# Patient Record
Sex: Female | Born: 1955 | Race: White | Hispanic: No | Marital: Married | Smoking: Current some day smoker
Health system: Southern US, Community
[De-identification: ages and names within clinical notes are randomized; demographics above are authoritative.]

## PROBLEM LIST (undated history)

## (undated) DIAGNOSIS — F32A Depression, unspecified: Secondary | ICD-10-CM

## (undated) DIAGNOSIS — F419 Anxiety disorder, unspecified: Secondary | ICD-10-CM

## (undated) DIAGNOSIS — F329 Major depressive disorder, single episode, unspecified: Secondary | ICD-10-CM

## (undated) HISTORY — DX: Anxiety disorder, unspecified: F41.9

## (undated) HISTORY — PX: LEG SURGERY: SHX1003

---

## 2007-11-29 ENCOUNTER — Ambulatory Visit: Payer: Self-pay | Admitting: Internal Medicine

## 2008-09-12 ENCOUNTER — Ambulatory Visit: Payer: Self-pay | Admitting: Internal Medicine

## 2008-11-04 ENCOUNTER — Ambulatory Visit: Payer: Self-pay | Admitting: Family Medicine

## 2008-11-29 ENCOUNTER — Ambulatory Visit: Payer: Self-pay | Admitting: Internal Medicine

## 2009-04-29 ENCOUNTER — Emergency Department: Payer: Self-pay | Admitting: Emergency Medicine

## 2009-10-06 ENCOUNTER — Ambulatory Visit: Payer: Self-pay | Admitting: Unknown Physician Specialty

## 2010-03-30 ENCOUNTER — Ambulatory Visit: Payer: Self-pay | Admitting: Pain Medicine

## 2010-04-14 ENCOUNTER — Ambulatory Visit: Payer: Self-pay | Admitting: Pain Medicine

## 2010-05-18 ENCOUNTER — Ambulatory Visit: Payer: Self-pay | Admitting: Pain Medicine

## 2010-07-01 ENCOUNTER — Ambulatory Visit: Payer: Self-pay

## 2011-08-20 ENCOUNTER — Ambulatory Visit: Payer: Self-pay | Admitting: Internal Medicine

## 2011-10-28 DIAGNOSIS — G4733 Obstructive sleep apnea (adult) (pediatric): Secondary | ICD-10-CM | POA: Insufficient documentation

## 2011-10-28 DIAGNOSIS — F32A Depression, unspecified: Secondary | ICD-10-CM | POA: Insufficient documentation

## 2011-10-28 DIAGNOSIS — R413 Other amnesia: Secondary | ICD-10-CM | POA: Insufficient documentation

## 2011-10-28 DIAGNOSIS — F329 Major depressive disorder, single episode, unspecified: Secondary | ICD-10-CM | POA: Insufficient documentation

## 2012-04-19 ENCOUNTER — Ambulatory Visit: Payer: Self-pay | Admitting: Internal Medicine

## 2013-08-24 ENCOUNTER — Emergency Department: Payer: Self-pay | Admitting: Emergency Medicine

## 2013-08-27 DIAGNOSIS — S82143A Displaced bicondylar fracture of unspecified tibia, initial encounter for closed fracture: Secondary | ICD-10-CM | POA: Insufficient documentation

## 2013-11-27 DIAGNOSIS — F410 Panic disorder [episodic paroxysmal anxiety] without agoraphobia: Secondary | ICD-10-CM | POA: Diagnosis not present

## 2013-11-27 DIAGNOSIS — M545 Low back pain: Secondary | ICD-10-CM | POA: Diagnosis not present

## 2013-11-27 DIAGNOSIS — Z720 Tobacco use: Secondary | ICD-10-CM | POA: Diagnosis not present

## 2013-11-27 DIAGNOSIS — M5430 Sciatica, unspecified side: Secondary | ICD-10-CM | POA: Diagnosis not present

## 2013-11-27 DIAGNOSIS — R5383 Other fatigue: Secondary | ICD-10-CM | POA: Diagnosis not present

## 2013-11-27 DIAGNOSIS — M79604 Pain in right leg: Secondary | ICD-10-CM | POA: Diagnosis not present

## 2013-11-27 DIAGNOSIS — F5102 Adjustment insomnia: Secondary | ICD-10-CM | POA: Diagnosis not present

## 2013-11-27 DIAGNOSIS — F411 Generalized anxiety disorder: Secondary | ICD-10-CM | POA: Diagnosis not present

## 2013-12-07 DIAGNOSIS — J1189 Influenza due to unidentified influenza virus with other manifestations: Secondary | ICD-10-CM | POA: Diagnosis not present

## 2013-12-07 DIAGNOSIS — R112 Nausea with vomiting, unspecified: Secondary | ICD-10-CM | POA: Diagnosis not present

## 2013-12-12 DIAGNOSIS — R509 Fever, unspecified: Secondary | ICD-10-CM | POA: Diagnosis not present

## 2013-12-18 DIAGNOSIS — J1189 Influenza due to unidentified influenza virus with other manifestations: Secondary | ICD-10-CM | POA: Diagnosis not present

## 2013-12-18 DIAGNOSIS — J069 Acute upper respiratory infection, unspecified: Secondary | ICD-10-CM | POA: Diagnosis not present

## 2014-03-26 DIAGNOSIS — D485 Neoplasm of uncertain behavior of skin: Secondary | ICD-10-CM | POA: Diagnosis not present

## 2014-03-26 DIAGNOSIS — H9201 Otalgia, right ear: Secondary | ICD-10-CM | POA: Diagnosis not present

## 2014-03-26 DIAGNOSIS — J069 Acute upper respiratory infection, unspecified: Secondary | ICD-10-CM | POA: Diagnosis not present

## 2014-03-26 DIAGNOSIS — F17210 Nicotine dependence, cigarettes, uncomplicated: Secondary | ICD-10-CM | POA: Diagnosis not present

## 2014-03-26 DIAGNOSIS — F329 Major depressive disorder, single episode, unspecified: Secondary | ICD-10-CM | POA: Diagnosis not present

## 2014-04-05 DIAGNOSIS — L820 Inflamed seborrheic keratosis: Secondary | ICD-10-CM | POA: Diagnosis not present

## 2014-04-05 DIAGNOSIS — B360 Pityriasis versicolor: Secondary | ICD-10-CM | POA: Diagnosis not present

## 2014-06-25 DIAGNOSIS — F329 Major depressive disorder, single episode, unspecified: Secondary | ICD-10-CM | POA: Diagnosis not present

## 2014-06-25 DIAGNOSIS — M79604 Pain in right leg: Secondary | ICD-10-CM | POA: Diagnosis not present

## 2014-06-25 DIAGNOSIS — F411 Generalized anxiety disorder: Secondary | ICD-10-CM | POA: Diagnosis not present

## 2014-06-25 DIAGNOSIS — J309 Allergic rhinitis, unspecified: Secondary | ICD-10-CM | POA: Diagnosis not present

## 2014-06-25 DIAGNOSIS — M5440 Lumbago with sciatica, unspecified side: Secondary | ICD-10-CM | POA: Diagnosis not present

## 2014-07-26 DIAGNOSIS — F17210 Nicotine dependence, cigarettes, uncomplicated: Secondary | ICD-10-CM | POA: Diagnosis not present

## 2014-07-26 DIAGNOSIS — F331 Major depressive disorder, recurrent, moderate: Secondary | ICD-10-CM | POA: Diagnosis not present

## 2014-07-26 DIAGNOSIS — J019 Acute sinusitis, unspecified: Secondary | ICD-10-CM | POA: Diagnosis not present

## 2014-07-26 DIAGNOSIS — F411 Generalized anxiety disorder: Secondary | ICD-10-CM | POA: Diagnosis not present

## 2014-10-24 DIAGNOSIS — Z0000 Encounter for general adult medical examination without abnormal findings: Secondary | ICD-10-CM | POA: Diagnosis not present

## 2014-10-24 DIAGNOSIS — Z0001 Encounter for general adult medical examination with abnormal findings: Secondary | ICD-10-CM | POA: Diagnosis not present

## 2014-10-24 DIAGNOSIS — Z202 Contact with and (suspected) exposure to infections with a predominantly sexual mode of transmission: Secondary | ICD-10-CM | POA: Diagnosis not present

## 2014-10-24 DIAGNOSIS — F331 Major depressive disorder, recurrent, moderate: Secondary | ICD-10-CM | POA: Diagnosis not present

## 2014-10-24 DIAGNOSIS — M5440 Lumbago with sciatica, unspecified side: Secondary | ICD-10-CM | POA: Diagnosis not present

## 2014-10-24 DIAGNOSIS — E039 Hypothyroidism, unspecified: Secondary | ICD-10-CM | POA: Diagnosis not present

## 2014-10-24 DIAGNOSIS — I10 Essential (primary) hypertension: Secondary | ICD-10-CM | POA: Diagnosis not present

## 2014-10-24 DIAGNOSIS — F5105 Insomnia due to other mental disorder: Secondary | ICD-10-CM | POA: Diagnosis not present

## 2014-10-24 DIAGNOSIS — E782 Mixed hyperlipidemia: Secondary | ICD-10-CM | POA: Diagnosis not present

## 2014-10-24 DIAGNOSIS — F17210 Nicotine dependence, cigarettes, uncomplicated: Secondary | ICD-10-CM | POA: Diagnosis not present

## 2014-10-24 DIAGNOSIS — B179 Acute viral hepatitis, unspecified: Secondary | ICD-10-CM | POA: Diagnosis not present

## 2014-10-24 DIAGNOSIS — F411 Generalized anxiety disorder: Secondary | ICD-10-CM | POA: Diagnosis not present

## 2014-10-24 DIAGNOSIS — E559 Vitamin D deficiency, unspecified: Secondary | ICD-10-CM | POA: Diagnosis not present

## 2014-12-19 DIAGNOSIS — F5105 Insomnia due to other mental disorder: Secondary | ICD-10-CM | POA: Diagnosis not present

## 2014-12-19 DIAGNOSIS — F331 Major depressive disorder, recurrent, moderate: Secondary | ICD-10-CM | POA: Diagnosis not present

## 2014-12-24 ENCOUNTER — Ambulatory Visit (INDEPENDENT_AMBULATORY_CARE_PROVIDER_SITE_OTHER): Payer: Medicare Other | Admitting: Psychiatry

## 2014-12-24 ENCOUNTER — Encounter: Payer: Self-pay | Admitting: Psychiatry

## 2014-12-24 VITALS — BP 118/82 | HR 105 | Temp 98.5°F | Ht 60.0 in | Wt 151.4 lb

## 2014-12-24 DIAGNOSIS — F411 Generalized anxiety disorder: Secondary | ICD-10-CM

## 2014-12-24 DIAGNOSIS — F331 Major depressive disorder, recurrent, moderate: Secondary | ICD-10-CM | POA: Diagnosis not present

## 2014-12-24 MED ORDER — LAMOTRIGINE 25 MG PO TABS: 25.0000 mg | ORAL_TABLET | Freq: Every day | ORAL | Status: DC

## 2014-12-24 MED ORDER — TRAZODONE HCL 50 MG PO TABS: 50.0000 mg | ORAL_TABLET | Freq: Every day | ORAL | Status: DC

## 2014-12-24 MED ORDER — DULOXETINE HCL 60 MG PO CPEP: 60.0000 mg | ORAL_CAPSULE | Freq: Every day | ORAL | Status: DC

## 2014-12-24 MED ORDER — ALPRAZOLAM 0.5 MG PO TABS: 0.5000 mg | ORAL_TABLET | Freq: Two times a day (BID) | ORAL | Status: DC | PRN

## 2014-12-24 NOTE — Progress Notes (Signed)
Psychiatric Initial Adult Assessment   Patient Identification: Pamela Mckee MRN:  [id number] Date of Evaluation:  12/24/2014 Referral Source: [facility] Chief Complaint:   Chief Complaint    Establish Care; Anxiety; Depression     Visit Diagnosis:    ICD-9-CM ICD-10-CM   1. MDD (major depressive disorder), recurrent episode, moderate (HCC) 296.32 F33.1   2. Anxiety state 300.00 F41.1    Diagnosis:   Patient Active Problem List   Diagnosis Date Noted  . Closed fracture of tibial plateau [S82.143A] 08/27/2013  . Clinical depression [F32.9] 10/28/2011  . Amnesia [R41.3] 10/28/2011  . Obstructive apnea [G47.33] 10/28/2011   History of Present Illness:    Patient is a 59 year old female who presented for her  primary care office [facility]. She reported that she has long history of anxiety and depression and has been following her primary care physician's office as she has 2 sons with hunters syndrome. They were diagnosed since they were born. She reported that they have been following Dr. Jimmye Norman in this practice. She reported that they her condition is getting worse and she is the sole caregiver and has been feeling stressed out as she has to provide for them. Patient reported that she has been taking Cymbalta trazodone and Xanax on a when necessary basis. She reported that she was transferred here as her mood has been getting worse and she feels anxious and agitated at times. She was also tearful during the interview as she was talking in detail about her son's conditions. She reported that she has taking them to several different places and her son is undergoing hip replacement and was septic. He is awaiting an other surgery in this month. She reported that they take care of each other and she helps them as well as she does not want anyone else to be involved in their care. Patient reported that she sleeps well with the help of trazodone and does not want to try any  other medication. Patient reported that her primary care physician has already tried other medications for her depression and she did not respond well to them . She stated that she is interested in taking a mood stabilizer to control her more symptoms at this time. Patient currently denied having any suicidal homicidal ideations or plans. She stated that her daughter is addicted to medications and they have to hide their bills. She reported that she took all her son's Klonopin and then patient was providing her Xanax to the son as he ran out of the Klonopin. She reported that she has a 36-year-old grandson who does not have Hunter syndrome and she is very excited about the same.     Elements:  Location:  Depression and anxiety. Associated Signs/Symptoms: Depression Symptoms:  depressed mood, insomnia, fatigue, anxiety, loss of energy/fatigue, disturbed sleep, increased appetite, (Hypo) Manic Symptoms:  Flight of Ideas, Labiality of Mood, Anxiety Symptoms:  Excessive Worry, Psychotic Symptoms:  none PTSD Symptoms: Negative NA  Past Medical History:  Past Medical History  Diagnosis Date  . Anxiety     Past Surgical History  Procedure Laterality Date  . Cesarean section    . Leg surgery Right    Family History:  Family History  Problem Relation Age of Onset  . Alzheimer's disease Mother   . Dementia Mother   . Anxiety disorder Sister   . Thyroid cancer Sister    Social History:   Social History   Social History  . Marital Status: Married  Spouse Name: N/A  . Number of Children: N/A  . Years of Education: N/A   Social History Main Topics  . Smoking status: Current Some Day Smoker -- 0.20 packs/day    Types: Cigarettes    Start date: 12/24/1971  . Smokeless tobacco: Never Used  . Alcohol Use: None  . Drug Use: No     Comment: not since 1988  . Sexual Activity: Not Currently   Other Topics Concern  . None   Social History Narrative  . None   Additional  Social History:   Married x 2. Has two sons with Hunter syndrome. Has a daughter and she is a drug user. She stated that is concerned about her drug use. She has a 57 years old grand son who is not a carrier of X linked Hunter disease.   Musculoskeletal: Strength & Muscle Tone: within normal limits Gait & Station: normal Patient leans: Right  Psychiatric Specialty Exam: HPI  ROS  Blood pressure 118/82, pulse 105, temperature 98.5 F (36.9 C), temperature source Tympanic, height 5' (1.524 m), weight 151 lb 6.4 oz (68.675 kg), SpO2 97 %.Body mass index is 29.57 kg/(m^2).  General Appearance: Casual and Fairly Groomed  Eye Contact:  Fair  Speech:  Clear and Coherent  Volume:  Normal  Mood:  Anxious and Depressed  Affect:  Congruent  Thought Process:  Circumstantial  Orientation:  Full (Time, Place, and Person)  Thought Content:  WDL  Suicidal Thoughts:  No  Homicidal Thoughts:  No  Memory:  Immediate;   Fair  Judgement:  Fair  Insight:  Fair  Psychomotor Activity:  Normal  Concentration:  Fair  Recall:  [employer] [place]  Language: Fair  Akathisia:  No  Handed:  Right  AIMS (if indicated):    Assets:  Communication Skills Desire for Improvement Housing Physical Health Social Support  ADL's:  Intact  Cognition: WNL  Sleep:     Is the patient at risk to self?  No. Has the patient been a risk to self in the past 6 months?  No. Has the patient been a risk to self within the distant past?  No. Is the patient a risk to others?  No. Has the patient been a risk to others in the past 6 months?  No. Has the patient been a risk to others within the distant past?  No.  Allergies:  No Known Allergies Current Medications: Current Outpatient Prescriptions  Medication Sig Dispense Refill  . acetaminophen (TYLENOL) 325 MG tablet Take by mouth.    . ALPRAZolam (XANAX) 0.5 MG tablet Take by mouth.    Marland Kitchen atorvastatin (LIPITOR) 10 MG tablet     . DULoxetine (CYMBALTA) 30  MG capsule     . ketoconazole (NIZORAL) 2 % cream Apply to affected areas 1-3 times a week as needed for affected areas on trunk    . traMADol (ULTRAM) 50 MG tablet     . traZODone (DESYREL) 50 MG tablet     . Vitamin D, Ergocalciferol, (DRISDOL) 50000 UNITS CAPS capsule Take by mouth.     No current facility-administered medications for this visit.    Previous Psychotropic Medications:  Prozac Wellbutrin Celexa Zoloft Lexapro Valium Xanax Trazodone   Substance Abuse History in the last 12 months:  No.  Consequences of Substance Abuse: Negative NA  Medical Decision Making:  Review of Psycho-Social Stressors (1)  Treatment Plan Summary: Medication management   Mood stabilization  Discussed with patient about starting her  on lamotrigine 25 mg and she agreed with the plan. She will continue on Cymbalta 60 mg by mouth daily  Insomnia Patient will take trazodone 50 mg at bedtime  Anxiety Patient takes Xanax 0.5 mg daily and she has supply of the medications as she recently filled 90 pills on 12/04/2014 and has currently 55 fills of the medication and she takes it on a when necessary basis  Follow-up 1 month or earlier depending on her symptoms   More than 50% of the time spent in psychoeducation, counseling and coordination of care.    This note was generated in part or whole with voice recognition software. Voice regonition is usually quite accurate but there are transcription errors that can and very often do occur. I apologize for any typographical errors that were not detected and corrected.     Rainey Pines 11/1/20162:04 PM

## 2015-01-23 ENCOUNTER — Ambulatory Visit (INDEPENDENT_AMBULATORY_CARE_PROVIDER_SITE_OTHER): Payer: Medicare Other | Admitting: Psychiatry

## 2015-01-23 ENCOUNTER — Encounter: Payer: Self-pay | Admitting: Psychiatry

## 2015-01-23 VITALS — BP 120/84 | HR 99 | Temp 98.2°F | Ht 60.0 in | Wt 149.8 lb

## 2015-01-23 DIAGNOSIS — F331 Major depressive disorder, recurrent, moderate: Secondary | ICD-10-CM | POA: Diagnosis not present

## 2015-01-23 MED ORDER — DULOXETINE HCL 60 MG PO CPEP: 60.0000 mg | ORAL_CAPSULE | Freq: Every day | ORAL | Status: DC

## 2015-01-23 MED ORDER — LAMOTRIGINE 25 MG PO TABS: 25.0000 mg | ORAL_TABLET | Freq: Every day | ORAL | Status: DC

## 2015-01-23 MED ORDER — TRAZODONE HCL 50 MG PO TABS: 50.0000 mg | ORAL_TABLET | Freq: Every day | ORAL | Status: DC

## 2015-01-23 NOTE — Progress Notes (Signed)
Psychiatric MD NP Follow Up Note   Patient Identification: Pamela Mckee MRN:  [id number] Date of Evaluation:  01/23/2015 Referral Source: [facility] Chief Complaint:   Chief Complaint    Follow-up; Medication Refill     Visit Diagnosis:    ICD-9-CM ICD-10-CM   1. MDD (major depressive disorder), recurrent episode, moderate (HCC) 296.32 F33.1    Diagnosis:   Patient Active Problem List   Diagnosis Date Noted  . Closed fracture of tibial plateau [S82.143A] 08/27/2013  . Clinical depression [F32.9] 10/28/2011  . Amnesia [R41.3] 10/28/2011  . Obstructive apnea [G47.33] 10/28/2011   History of Present Illness:    Patient is a 59 year old female who presented for follow-up. She reported that she has been doing well and enjoyed the Thanksgiving with her sons. Patient reported that her daughter is coming from [place] for the Christmas and she has to hide all her pills from her daughter as well has her son's pain medication as her daughter has history of addiction. She was concerned about her behavior as she stole all their pills during her previous visit in the summer. Patient stated that she spent 6 hours at [facility] yesterday due to her son's illness as he has complicated medical history. She reported that he stresses her out and she has been feeling tired due to his behavior. However she is tries to be more supportive of his medical complications and he is going to have a hip surgery in early part of next year and will have a three-month rehabilitation. She was talking at length about his medical problems as well as about his behavioral issues. She reported that she has been compliant with her medications and her medications were filled recently by her primary care physician at [facility]. They have been prescribing her Xanax on a regular basis. She reported that she sleeps well with the help of trazodone. She currently denied having any adverse effects to the medication.  She reported that she has started taking lamotrigine and it has helped her and her mood is improving but she does not want to titrate the dose at this time. She denied having any mood swings anger anxiety or paranoia. She denied having any perceptual disturbances. She denied having any suicidal homicidal ideations or plans.     Elements:  Location:  Depression and anxiety. Associated Signs/Symptoms: Depression Symptoms:  depressed mood, insomnia, fatigue, anxiety, loss of energy/fatigue, disturbed sleep, increased appetite, (Hypo) Manic Symptoms:  Flight of Ideas, Labiality of Mood, Anxiety Symptoms:  Excessive Worry, Psychotic Symptoms:  none PTSD Symptoms: Negative NA  Past Medical History:  Past Medical History  Diagnosis Date  . Anxiety     Past Surgical History  Procedure Laterality Date  . Cesarean section    . Leg surgery Right    Family History:  Family History  Problem Relation Age of Onset  . Alzheimer's disease Mother   . Dementia Mother   . Anxiety disorder Sister   . Thyroid cancer Sister    Social History:   Social History   Social History  . Marital Status: Married    Spouse Name: N/A  . Number of Children: N/A  . Years of Education: N/A   Social History Main Topics  . Smoking status: Current Some Day Smoker -- 0.20 packs/day    Types: Cigarettes    Start date: 12/24/1971  . Smokeless tobacco: Never Used  . Alcohol Use: No  . Drug Use: No     Comment:  not since 1988  . Sexual Activity: Not Currently   Other Topics Concern  . None   Social History Narrative   Additional Social History:   Married x 2. Has two sons with Hunter syndrome. Has a daughter and she is a drug user. She stated that is concerned about her drug use. She has a 37 years old grand son who is not a carrier of X linked Hunter disease.   Musculoskeletal: Strength & Muscle Tone: within normal limits Gait & Station: normal Patient leans: Right  Psychiatric Specialty  Exam: HPI   ROS   Blood pressure 120/84, pulse 99, temperature 98.2 F (36.8 C), temperature source Tympanic, height 5' (1.524 m), weight 149 lb 12.8 oz (67.949 kg), SpO2 96 %.Body mass index is 29.26 kg/(m^2).  General Appearance: Casual and Fairly Groomed  Eye Contact:  Fair  Speech:  Clear and Coherent  Volume:  Normal  Mood:  Anxious and Depressed  Affect:  Congruent  Thought Process:  Circumstantial  Orientation:  Full (Time, Place, and Person)  Thought Content:  WDL  Suicidal Thoughts:  No  Homicidal Thoughts:  No  Memory:  Immediate;   Fair  Judgement:  Fair  Insight:  Fair  Psychomotor Activity:  Normal  Concentration:  Fair  Recall:  [employer] [place]  Language: Fair  Akathisia:  No  Handed:  Right  AIMS (if indicated):    Assets:  Communication Skills Desire for Improvement Housing Physical Health Social Support  ADL's:  Intact  Cognition: WNL  Sleep:     Is the patient at risk to self?  No. Has the patient been a risk to self in the past 6 months?  No. Has the patient been a risk to self within the distant past?  No. Is the patient a risk to others?  No. Has the patient been a risk to others in the past 6 months?  No. Has the patient been a risk to others within the distant past?  No.  Allergies:  No Known Allergies Current Medications: Current Outpatient Prescriptions  Medication Sig Dispense Refill  . acetaminophen (TYLENOL) 325 MG tablet Take by mouth.    . ALPRAZolam (XANAX) 0.5 MG tablet Take 1 tablet (0.5 mg total) by mouth 2 (two) times daily as needed for anxiety (Pt has 55 pills - no meds given at this time. She refilled on 12/04/14). 2 tablet 1  . atorvastatin (LIPITOR) 10 MG tablet     . DULoxetine (CYMBALTA) 60 MG capsule Take 1 capsule (60 mg total) by mouth daily. 30 capsule 1  . ketoconazole (NIZORAL) 2 % cream Apply to affected areas 1-3 times a week as needed for affected areas on trunk    . lamoTRIgine (LAMICTAL) 25 MG  tablet Take 1 tablet (25 mg total) by mouth daily. 30 tablet 1  . traMADol (ULTRAM) 50 MG tablet     . traZODone (DESYREL) 50 MG tablet Take 1 tablet (50 mg total) by mouth at bedtime. 30 tablet 1  . Vitamin D, Ergocalciferol, (DRISDOL) 50000 UNITS CAPS capsule Take by mouth.     No current facility-administered medications for this visit.    Previous Psychotropic Medications:  Prozac Wellbutrin Celexa Zoloft Lexapro Valium Xanax Trazodone   Substance Abuse History in the last 12 months:  No.  Consequences of Substance Abuse: Negative NA  Medical Decision Making:  Review of Psycho-Social Stressors (1)  Treatment Plan Summary: Medication management   Mood stabilization  Continue  lamotrigine 25  mg one pill daily.  She will continue on Cymbalta 60 mg by mouth daily  Insomnia Patient will take trazodone 50 mg at bedtime  Anxiety Patient takes Xanax 0.5 mg daily and  it is being prescribed by her primary care physician  Follow-up 2 month or earlier depending on her symptoms   More than 50% of the time spent in psychoeducation, counseling and coordination of care.    This note was generated in part or whole with voice recognition software. Voice regonition is usually quite accurate but there are transcription errors that can and very often do occur. I apologize for any typographical errors that were not detected and corrected.     Omir Cooprider 12/1/201610:07 AM

## 2015-01-27 DIAGNOSIS — F331 Major depressive disorder, recurrent, moderate: Secondary | ICD-10-CM | POA: Diagnosis not present

## 2015-01-27 DIAGNOSIS — J019 Acute sinusitis, unspecified: Secondary | ICD-10-CM | POA: Diagnosis not present

## 2015-01-27 DIAGNOSIS — H8149 Vertigo of central origin, unspecified ear: Secondary | ICD-10-CM | POA: Diagnosis not present

## 2015-03-27 ENCOUNTER — Ambulatory Visit: Payer: Medicare Other | Admitting: Psychiatry

## 2015-04-01 ENCOUNTER — Encounter: Payer: Self-pay | Admitting: Psychiatry

## 2015-04-01 ENCOUNTER — Ambulatory Visit (INDEPENDENT_AMBULATORY_CARE_PROVIDER_SITE_OTHER): Payer: Medicare Other | Admitting: Psychiatry

## 2015-04-01 VITALS — BP 138/88 | HR 123 | Temp 97.1°F | Ht 60.0 in | Wt 152.0 lb

## 2015-04-01 DIAGNOSIS — F331 Major depressive disorder, recurrent, moderate: Secondary | ICD-10-CM

## 2015-04-01 MED ORDER — DULOXETINE HCL 60 MG PO CPEP: 60.0000 mg | ORAL_CAPSULE | Freq: Every day | ORAL | Status: DC

## 2015-04-01 MED ORDER — LAMOTRIGINE 25 MG PO TABS: 50.0000 mg | ORAL_TABLET | Freq: Every day | ORAL | Status: DC

## 2015-04-01 MED ORDER — ALPRAZOLAM 0.5 MG PO TABS: 0.5000 mg | ORAL_TABLET | Freq: Every evening | ORAL | Status: DC | PRN

## 2015-04-01 MED ORDER — TRAZODONE HCL 50 MG PO TABS: 50.0000 mg | ORAL_TABLET | Freq: Every day | ORAL | Status: DC

## 2015-04-01 NOTE — Progress Notes (Signed)
Psychiatric MD NP Follow Up Note   Patient Identification: Pamela Mckee MRN:  [id number] Date of Evaluation:  04/01/2015 Referral Source: [facility] Chief Complaint:   Chief Complaint    Follow-up; Medication Refill     Visit Diagnosis:    ICD-9-CM ICD-10-CM   1. MDD (major depressive disorder), recurrent episode, moderate (HCC) 296.32 F33.1    Diagnosis:   Patient Active Problem List   Diagnosis Date Noted  . Closed fracture of tibial plateau [S82.143A] 08/27/2013  . Clinical depression [F32.9] 10/28/2011  . Amnesia [R41.3] 10/28/2011  . Obstructive apnea [G47.33] 10/28/2011   History of Present Illness:    Patient is a 60 year old female who presented for follow-up. She reported that she has been stressed out due to her sons who are disabled. She reported that one of her son keeps calling her in the middle of the night as he wants to be taken care of. She reported that she is becoming frustrated and is loosing temper. She reported that he is also going for her and her surgery in the end of this month. Patient reported that she feels that she wants her medications to be adjusted. She reported that she is trying to help her sons as much as she can but both are disabled and having issues with their temper. She reported that she loses temper quickly with them. Patient reported that she has been taking lamotrigine daily and we discussed about titrating the dose of the medication. Patient reported that she has taken Xanax only one time as she does not like taking it on a regular basis. She currently denied having any suicidal homicidal ideations or plans. She denied having any perceptual disturbances. She appeared cooperative and was explaining all the symptoms in detail.    Past Medical History:  Past Medical History  Diagnosis Date  . Anxiety     Past Surgical History  Procedure Laterality Date  . Cesarean section    . Leg surgery Right    Family History:  Family  History  Problem Relation Age of Onset  . Alzheimer's disease Mother   . Dementia Mother   . Anxiety disorder Sister   . Thyroid cancer Sister    Social History:   Social History   Social History  . Marital Status: Married    Spouse Name: N/A  . Number of Children: N/A  . Years of Education: N/A   Social History Main Topics  . Smoking status: Current Some Day Smoker -- 0.20 packs/day    Types: Cigarettes    Start date: 12/24/1971  . Smokeless tobacco: Never Used  . Alcohol Use: No  . Drug Use: No     Comment: not since 1988  . Sexual Activity: Not Currently   Other Topics Concern  . None   Social History Narrative   Additional Social History:   Married x 2. Has two sons with Hunter syndrome. Has a daughter and she is a drug user. She stated that is concerned about her drug use. She has a 47 years old grand son who is not a carrier of X linked Hunter disease.   Musculoskeletal: Strength & Muscle Tone: within normal limits Gait & Station: normal Patient leans: Right  Psychiatric Specialty Exam: HPI   ROS   Blood pressure 138/88, pulse 123, temperature 97.1 F (36.2 C), temperature source Tympanic, height 5' (1.524 m), weight 152 lb (68.947 kg), SpO2 93 %.Body mass index is 29.69 kg/(m^2).  General Appearance: Casual and Fairly Groomed  Eye Contact:  Fair  Speech:  Pressured  Volume:  Normal  Mood:  Anxious  Affect:  Congruent  Thought Process:  Circumstantial  Orientation:  Full (Time, Place, and Person)  Thought Content:  WDL  Suicidal Thoughts:  No  Homicidal Thoughts:  No  Memory:  Immediate;   Fair  Judgement:  Fair  Insight:  Fair  Psychomotor Activity:  Normal  Concentration:  Fair  Recall:  [employer] [place]  Language: Fair  Akathisia:  No  Handed:  Right  AIMS (if indicated):    Assets:  Communication Skills Desire for Improvement Housing Physical Health Social Support  ADL's:  Intact  Cognition: WNL  Sleep:     Is the  patient at risk to self?  No. Has the patient been a risk to self in the past 6 months?  No. Has the patient been a risk to self within the distant past?  No. Is the patient a risk to others?  No. Has the patient been a risk to others in the past 6 months?  No. Has the patient been a risk to others within the distant past?  No.  Allergies:  No Known Allergies Current Medications: Current Outpatient Prescriptions  Medication Sig Dispense Refill  . acetaminophen (TYLENOL) 325 MG tablet Take by mouth.    . ALPRAZolam (XANAX) 0.5 MG tablet Take 1 tablet (0.5 mg total) by mouth 2 (two) times daily as needed for anxiety (Pt has 55 pills - no meds given at this time. She refilled on 12/04/14). 2 tablet 1  . atorvastatin (LIPITOR) 10 MG tablet     . DULoxetine (CYMBALTA) 60 MG capsule Take 1 capsule (60 mg total) by mouth daily. 30 capsule 1  . ketoconazole (NIZORAL) 2 % cream Apply to affected areas 1-3 times a week as needed for affected areas on trunk    . lamoTRIgine (LAMICTAL) 25 MG tablet Take 1 tablet (25 mg total) by mouth daily. 30 tablet 1  . traMADol (ULTRAM) 50 MG tablet     . traZODone (DESYREL) 50 MG tablet Take 1 tablet (50 mg total) by mouth at bedtime. 30 tablet 1  . Vitamin D, Ergocalciferol, (DRISDOL) 50000 UNITS CAPS capsule Take by mouth.     No current facility-administered medications for this visit.    Previous Psychotropic Medications:  Prozac Wellbutrin Celexa Zoloft Lexapro Valium Xanax Trazodone   Substance Abuse History in the last 12 months:  No.  Consequences of Substance Abuse: Negative NA  Medical Decision Making:  Review of Psycho-Social Stressors (1)  Treatment Plan Summary: Medication management   Mood stabilization  Continue  lamotrigine and I will titrate the dose to 50 g daily. She will continue on Cymbalta 60 mg by mouth daily  Insomnia Patient will take trazodone 50 mg at bedtime  Anxiety Patient takes Xanax 0.5 mg daily and  I  will refill on a when necessary basis.  Follow-up 3 month or earlier depending on her symptoms   More than 50% of the time spent in psychoeducation, counseling and coordination of care.    This note was generated in part or whole with voice recognition software. Voice regonition is usually quite accurate but there are transcription errors that can and very often do occur. I apologize for any typographical errors that were not detected and corrected.    Rainey Pines, MD  2/7/201710:38 AM

## 2015-04-15 ENCOUNTER — Emergency Department
Admission: EM | Admit: 2015-04-15 | Discharge: 2015-04-15 | Disposition: A | Discharge status: Home / Self Care | Payer: PRIVATE HEALTH INSURANCE | Attending: Emergency Medicine | Admitting: Emergency Medicine

## 2015-04-15 ENCOUNTER — Encounter: Payer: Self-pay | Admitting: Emergency Medicine

## 2015-04-15 DIAGNOSIS — F419 Anxiety disorder, unspecified: Secondary | ICD-10-CM | POA: Diagnosis not present

## 2015-04-15 DIAGNOSIS — Z79899 Other long term (current) drug therapy: Secondary | ICD-10-CM | POA: Diagnosis not present

## 2015-04-15 DIAGNOSIS — R002 Palpitations: Secondary | ICD-10-CM | POA: Diagnosis not present

## 2015-04-15 DIAGNOSIS — F17210 Nicotine dependence, cigarettes, uncomplicated: Secondary | ICD-10-CM | POA: Diagnosis not present

## 2015-04-15 HISTORY — DX: Major depressive disorder, single episode, unspecified: F32.9

## 2015-04-15 HISTORY — DX: Depression, unspecified: F32.A

## 2015-04-15 LAB — CBC
HCT: 39.8 % (ref 35.0–47.0)
Hemoglobin: 13.8 g/dL (ref 12.0–16.0)
MCH: 31.6 pg (ref 26.0–34.0)
MCHC: 34.6 g/dL (ref 32.0–36.0)
MCV: 91.2 fL (ref 80.0–100.0)
Platelets: 279 10*3/uL (ref 150–440)
RBC: 4.36 MIL/uL (ref 3.80–5.20)
RDW: 12.6 % (ref 11.5–14.5)
WBC: 8.6 10*3/uL (ref 3.6–11.0)

## 2015-04-15 LAB — COMPREHENSIVE METABOLIC PANEL
ALT: 22 U/L (ref 14–54)
AST: 21 U/L (ref 15–41)
Albumin: 4 g/dL (ref 3.5–5.0)
Alkaline Phosphatase: 76 U/L (ref 38–126)
Anion gap: 5 (ref 5–15)
BUN: 11 mg/dL (ref 6–20)
CO2: 28 mmol/L (ref 22–32)
Calcium: 9.1 mg/dL (ref 8.9–10.3)
Chloride: 105 mmol/L (ref 101–111)
Creatinine, Ser: 0.85 mg/dL (ref 0.44–1.00)
GFR calc Af Amer: >60 mL/min (ref >60)
GFR calc non Af Amer: >60 mL/min (ref >60)
Glucose, Bld: 83 mg/dL (ref 65–99)
Potassium: 4 mmol/L (ref 3.5–5.1)
Sodium: 138 mmol/L (ref 135–145)
Total Bilirubin: 0.6 mg/dL (ref 0.3–1.2)
Total Protein: 7.1 g/dL (ref 6.5–8.1)

## 2015-04-15 LAB — TSH: TSH: 1.093 u[IU]/mL (ref 0.350–4.500)

## 2015-04-15 LAB — TROPONIN I: Troponin I: <0.03 ng/mL (ref <0.031)

## 2015-04-15 LAB — FIBRIN DERIVATIVES D-DIMER (ARMC ONLY): Fibrin derivatives D-dimer (ARMC): 240 (ref 0–499)

## 2015-04-15 NOTE — ED Provider Notes (Signed)
[facility] Emergency Department Provider Note  ____________________________________________    I have reviewed the triage vital signs and the nursing notes.   HISTORY  Chief Complaint Hypertension    HPI Pamela Mckee is a 60 y.o. female who presents with complaints of hypertension and palpitations. Patient chooses this to her anxiety and difficulty taking care of her sons with disabilities. She denies chest pain today. No abdominal pain. No recent travel. No calf pain or swelling. She is had occasional nausea but not today. No fevers or chills.     Past Medical History  Diagnosis Date  . Anxiety   . Depression     Patient Active Problem List   Diagnosis Date Noted  . Closed fracture of tibial plateau 08/27/2013  . Clinical depression 10/28/2011  . Amnesia 10/28/2011  . Obstructive apnea 10/28/2011    Past Surgical History  Procedure Laterality Date  . Cesarean section    . Leg surgery Right     Current Outpatient Rx  Name  Route  Sig  Dispense  Refill  . acetaminophen (TYLENOL) 325 MG tablet   Oral   Take by mouth.         . ALPRAZolam (XANAX) 0.5 MG tablet   Oral   Take 1 tablet (0.5 mg total) by mouth at bedtime as needed for anxiety.   30 tablet   2   . atorvastatin (LIPITOR) 10 MG tablet               . DULoxetine (CYMBALTA) 60 MG capsule   Oral   Take 1 capsule (60 mg total) by mouth daily.   30 capsule   2   . ketoconazole (NIZORAL) 2 % cream      Apply to affected areas 1-3 times a week as needed for affected areas on trunk         . lamoTRIgine (LAMICTAL) 25 MG tablet   Oral   Take 2 tablets (50 mg total) by mouth daily.   60 tablet   2   . traMADol (ULTRAM) 50 MG tablet               . traZODone (DESYREL) 50 MG tablet   Oral   Take 1 tablet (50 mg total) by mouth at bedtime.   30 tablet   2   . Vitamin D, Ergocalciferol, (DRISDOL) 50000 UNITS CAPS capsule   Oral   Take by mouth.            Allergies Review of patient's allergies indicates no known allergies.  Family History  Problem Relation Age of Onset  . Alzheimer's disease Mother   . Dementia Mother   . Anxiety disorder Sister   . Thyroid cancer Sister     Social History Social History  Substance Use Topics  . Smoking status: Current Some Day Smoker -- 0.20 packs/day    Types: Cigarettes    Start date: 12/24/1971  . Smokeless tobacco: Never Used  . Alcohol Use: No    Review of Systems  Constitutional: Negative for fever. Eyes: Negative for visual changes. ENT: Negative for sore throat Cardiovascular: Negative for chest pain. Other for palpitations Respiratory: Negative for shortness of breath. Gastrointestinal: Negative for abdominal pain, vomiting and diarrhea. Genitourinary: Negative for dysuria. Musculoskeletal: Negative for back pain. Skin: Negative for rash. Neurological: Negative for headaches or focal weakness Psychiatric: Positive for anxiety    ____________________________________________   PHYSICAL EXAM:  VITAL SIGNS: ED Triage Vitals  Enc Vitals  Group     BP 04/15/15 1115 135/81 mmHg     Pulse Rate 04/15/15 1115 110     Resp 04/15/15 1115 20     Temp 04/15/15 1115 98.3 F (36.8 C)     Temp src --      SpO2 04/15/15 1115 97 %     Weight 04/15/15 1115 150 lb (68.04 kg)     Height 04/15/15 1115 5' (1.524 m)     Head Cir --      Peak Flow --      Pain Score 04/15/15 1121 0     Pain Loc --      Pain Edu? --      Excl. in [place]? --      Constitutional: Alert and oriented. Well appearing and in no distress. Eyes: Conjunctivae are normal.  ENT   Head: Normocephalic and atraumatic.   Mouth/Throat: Mucous membranes are moist. Cardiovascular: Normal rate, regular rhythm. Normal and symmetric distal pulses are present in all extremities.  Respiratory: Normal respiratory effort without tachypnea nor retractions. Breath sounds are clear and equal bilaterally.   Gastrointestinal: Soft and non-tender in all quadrants. No distention. There is no CVA tenderness. Genitourinary: deferred Musculoskeletal: Nontender with normal range of motion in all extremities. No lower extremity tenderness nor edema. Neurologic:  Normal speech and language. No gross focal neurologic deficits are appreciated. Skin:  Skin is warm, dry and intact. No rash noted. Psychiatric: Mood is anxious.. Patient exhibits appropriate insight and judgment.  ____________________________________________    LABS (pertinent positives/negatives)  Labs Reviewed  CBC  COMPREHENSIVE METABOLIC PANEL  TROPONIN I  FIBRIN DERIVATIVES D-DIMER ([facility] ONLY)  TSH    ____________________________________________   EKG  ED ECG REPORT I, Lavonia Drafts, the attending physician, personally viewed and interpreted this ECG.  Date: 04/15/2015 EKG Time: 12:19 PM Rate: 91 Rhythm: normal sinus rhythm QRS Axis: normal Intervals: normal ST/T Wave abnormalities: normal Conduction Disturbances: none Narrative Interpretation: unremarkable   ____________________________________________    RADIOLOGY I have personally reviewed any xrays that were ordered on this patient: None  ____________________________________________   PROCEDURES  Procedure(s) performed: none  Critical Care performed: none  ____________________________________________   INITIAL IMPRESSION / ASSESSMENT AND PLAN / ED COURSE  Pertinent labs & imaging results that were available during my care of the patient were reviewed by me and considered in my medical decision making (see chart for details).  Patient well-appearing in no distress. She complains of palpitations and elevated blood pressure although her blood pressure is not particularly high in the emergency department. She is tachycardic which is likely related to anxiety. Given the above we will obtain labs, EKG, d-dimer and TSH.  Lab work and EKG is  essentially unremarkable. Patient is feeling better and her blood pressures improved without therapy. We will discharge her with outpatient follow-up with her PCP.  ____________________________________________   FINAL CLINICAL IMPRESSION(S) / ED DIAGNOSES  Final diagnoses:  Palpitations     Lavonia Drafts, MD 04/15/15 1511

## 2015-04-15 NOTE — ED Notes (Signed)
AAOx3.  Skin warm and dry. NAD.  Ambulates with easy and steady gait.   

## 2015-04-15 NOTE — ED Notes (Signed)
Spoke with dr Clearnce Hasten regarding pt, no orders received at this time.

## 2015-04-15 NOTE — ED Notes (Addendum)
Pt to ed with c/o elevated blood pressure x 2 weeks with nausea.  Pt reports she has hx of anxiety and takes xanax and then feels better and her blood pressure gets better.  Pt with normal blood pressure at triage today, pt states "that is a fake blood pressure because I already took my xanax, but before that it was 164/106.  Pt denies chest pain, denies sob, denies all other symptoms.

## 2015-04-15 NOTE — Discharge Instructions (Signed)

## 2015-04-16 DIAGNOSIS — R011 Cardiac murmur, unspecified: Secondary | ICD-10-CM | POA: Diagnosis not present

## 2015-04-16 DIAGNOSIS — F411 Generalized anxiety disorder: Secondary | ICD-10-CM | POA: Diagnosis not present

## 2015-04-16 DIAGNOSIS — R000 Tachycardia, unspecified: Secondary | ICD-10-CM | POA: Diagnosis not present

## 2015-05-12 ENCOUNTER — Other Ambulatory Visit: Payer: Self-pay | Admitting: Psychiatry

## 2015-05-12 MED ORDER — DULOXETINE HCL 60 MG PO CPEP: 60.0000 mg | ORAL_CAPSULE | Freq: Every day | ORAL | Status: DC

## 2015-06-12 ENCOUNTER — Other Ambulatory Visit: Payer: Self-pay | Admitting: Psychiatry

## 2015-06-16 NOTE — Telephone Encounter (Signed)
Pt need appointment

## 2015-06-20 DIAGNOSIS — F430 Acute stress reaction: Secondary | ICD-10-CM | POA: Diagnosis not present

## 2015-06-20 DIAGNOSIS — Z634 Disappearance and death of family member: Secondary | ICD-10-CM | POA: Diagnosis not present

## 2015-07-01 ENCOUNTER — Encounter: Payer: Self-pay | Admitting: Psychiatry

## 2015-07-01 ENCOUNTER — Ambulatory Visit (INDEPENDENT_AMBULATORY_CARE_PROVIDER_SITE_OTHER): Payer: Medicare Other | Admitting: Psychiatry

## 2015-07-01 VITALS — BP 108/76 | HR 98 | Temp 97.7°F | Ht 60.0 in | Wt 151.2 lb

## 2015-07-01 DIAGNOSIS — Z634 Disappearance and death of family member: Secondary | ICD-10-CM

## 2015-07-01 DIAGNOSIS — F331 Major depressive disorder, recurrent, moderate: Secondary | ICD-10-CM

## 2015-07-01 MED ORDER — LAMOTRIGINE 25 MG PO TABS: 50.0000 mg | ORAL_TABLET | Freq: Every day | ORAL | Status: AC

## 2015-07-01 MED ORDER — DULOXETINE HCL 60 MG PO CPEP: 60.0000 mg | ORAL_CAPSULE | Freq: Every day | ORAL | Status: DC

## 2015-07-01 MED ORDER — TRAZODONE HCL 50 MG PO TABS
50.0000 mg | ORAL_TABLET | Freq: Every day | ORAL | Status: DC
Start: 2015-07-01 — End: 2017-03-11
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
## 2015-07-22 DIAGNOSIS — R000 Tachycardia, unspecified: Secondary | ICD-10-CM | POA: Diagnosis not present

## 2015-07-22 DIAGNOSIS — F430 Acute stress reaction: Secondary | ICD-10-CM | POA: Diagnosis not present

## 2015-07-22 DIAGNOSIS — Z634 Disappearance and death of family member: Secondary | ICD-10-CM | POA: Diagnosis not present

## 2015-08-24 IMAGING — CT CT OF THE RIGHT KNEE WITHOUT CONTRAST
5 of 6 series · 14 of 33 positions shown, 16 images · non-contrast
Comparison: None.

CLINICAL DATA: Lateral tibial plateau fracture.

EXAM:
CT OF THE LEFT KNEE WITHOUT CONTRAST
TECHNIQUE: Multidetector CT imaging of the LEFT knee was performed according to
the standard protocol. Multiplanar CT image reconstructions were
also generated.

[Series 3: knee · axial · 0.25mm/px · z∈[-270,-196]mm · 4 of 83 slices shown, 5 images]
[im 17/83  soft-tissue]
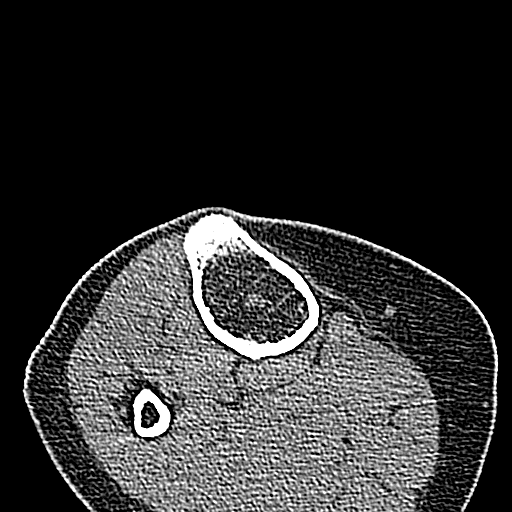
[im 17/83  bone]
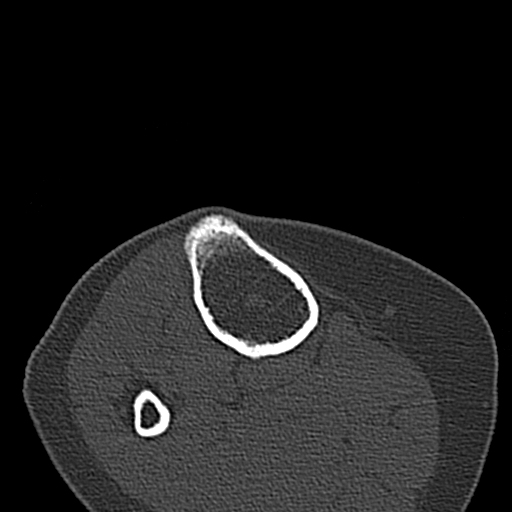
[im 33/83  bone]
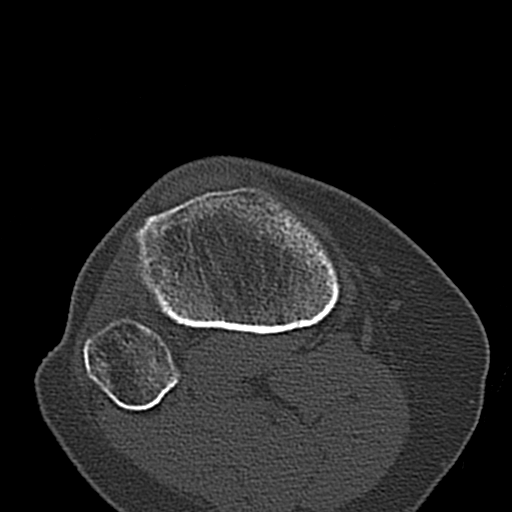
[im 50/83  bone]
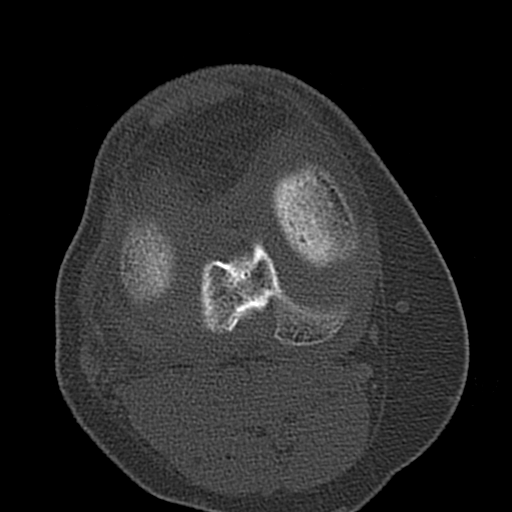
[im 66/83  bone]
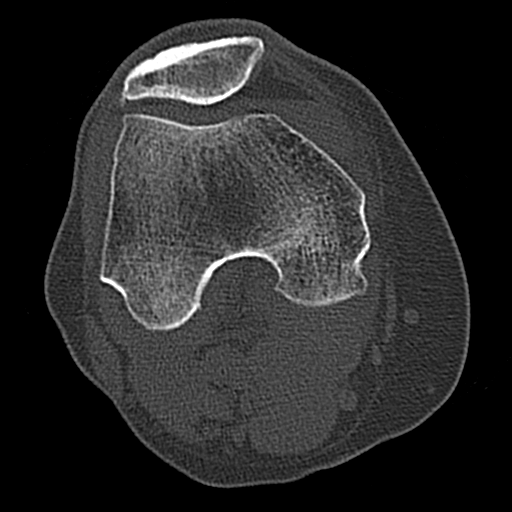

[Series 603: bone cor · coronal · 0.25mm/px · 1 of 43 slices shown]
[im 22/43  bone]
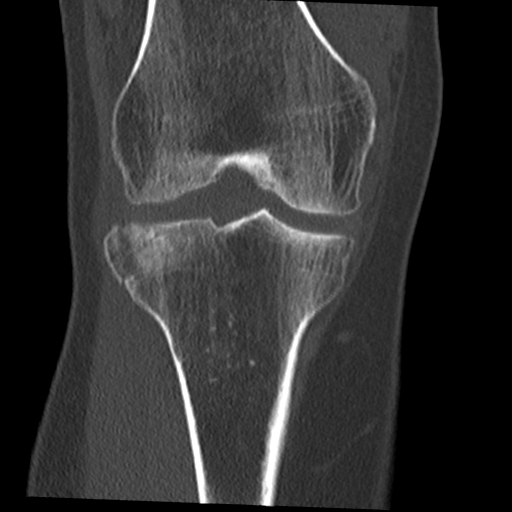

[Series 604: bone ax · axial · 0.25mm/px · z∈[-258,-222]mm · 2 of 56 slices shown]
[im 19/56  bone]
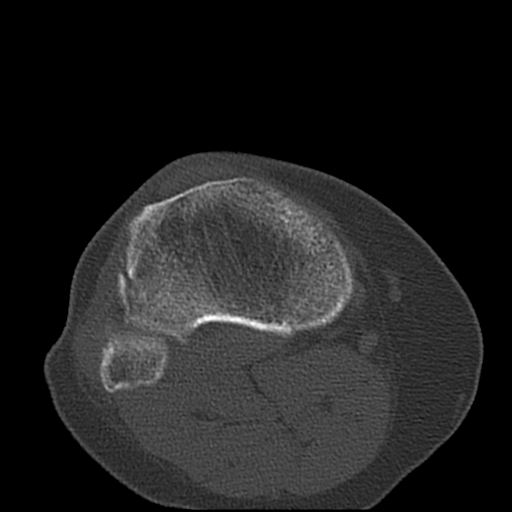
[im 37/56  bone]
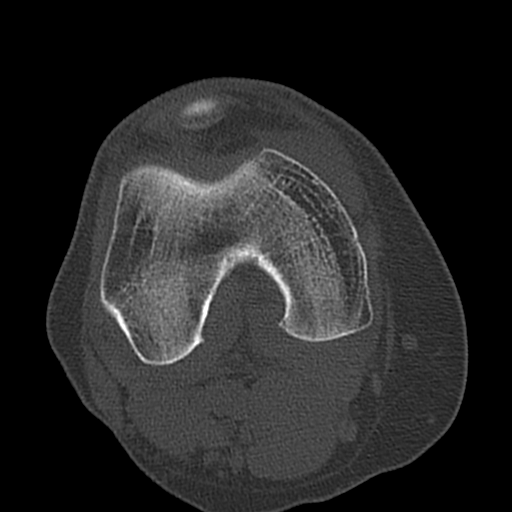

[Series 605: st sag · sagittal · 0.25mm/px · 5 of 48 slices shown, 6 images]
[im 16/48  bone]
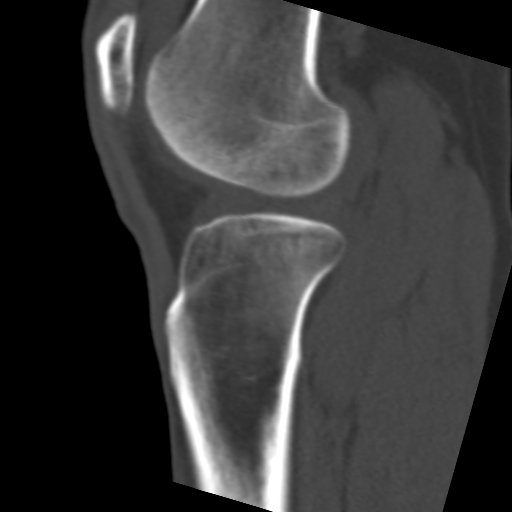
[im 20/48  bone]
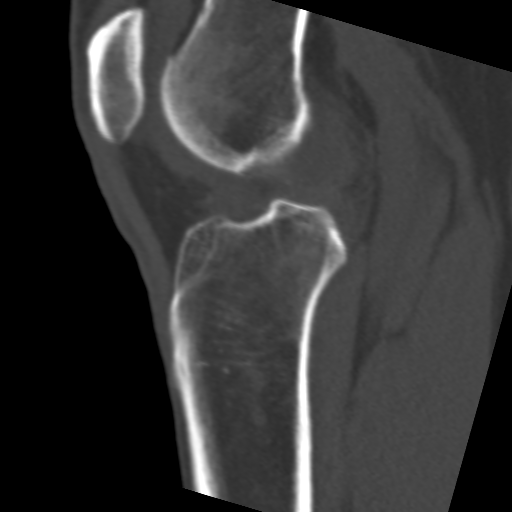
[im 24/48  soft-tissue]
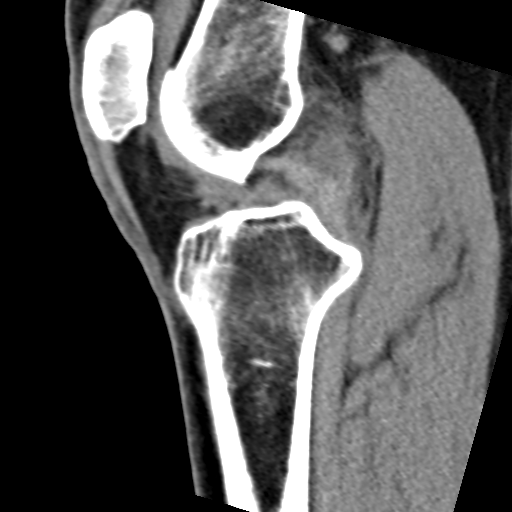
[im 24/48  bone]
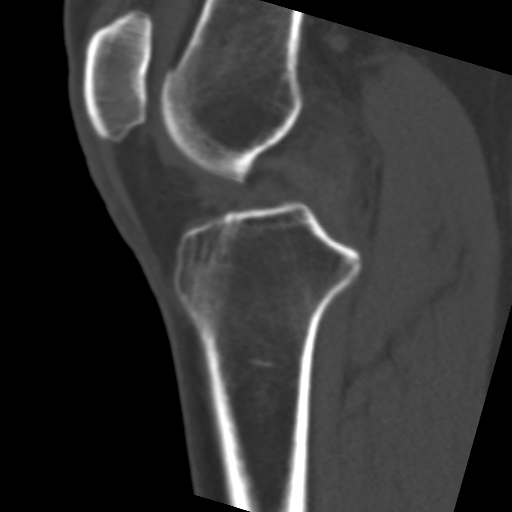
[im 28/48  bone]
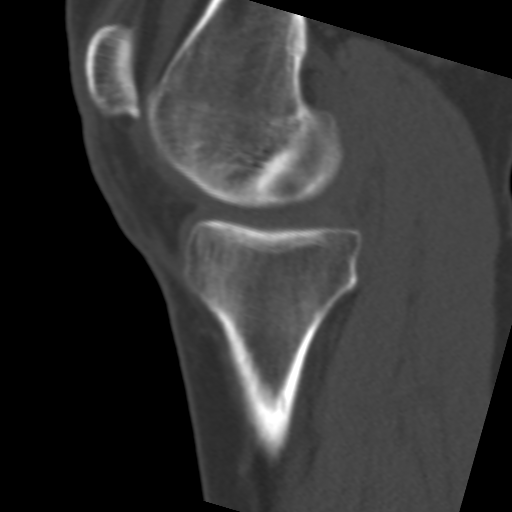
[im 32/48  bone]
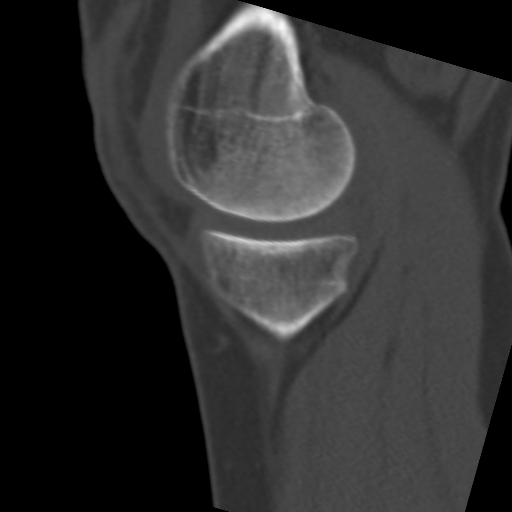

[Series 607: st ax · axial · 0.25mm/px · z∈[-267,-228]mm · 2 of 61 slices shown]
[im 21/61  bone]
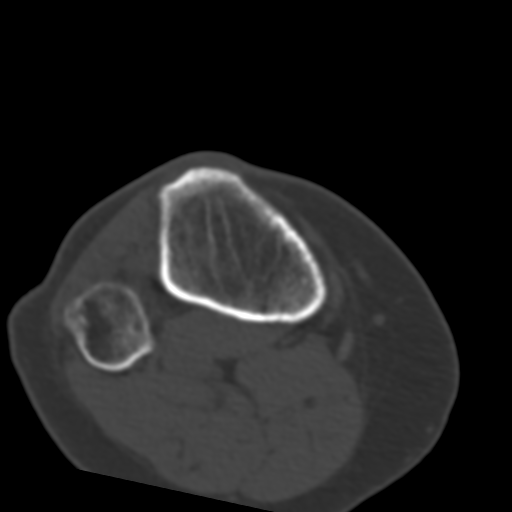
[im 41/61  bone]
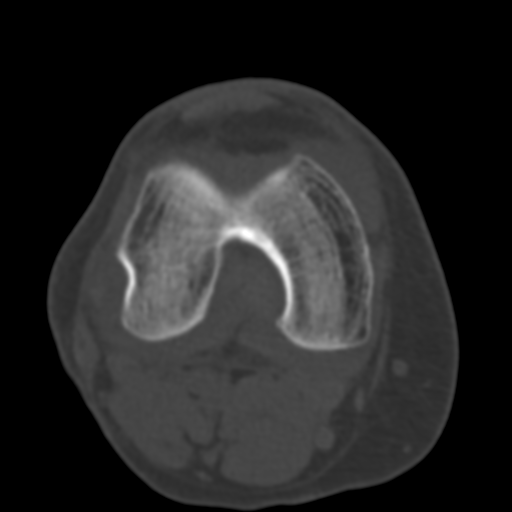

[14 of 33 positions shown; findings below may reference images not displayed]

FINDINGS: There is a comminuted, depressed lateral tibial plateau fracture.
There is approximately 5 mm of depression of the peripheral aspect
of the lateral tibial plateau. There is mild peripheral displacement
of the lateral most fracture fragment measuring approximately 2 mm.
There is no other fracture or dislocation. There is a large
lipohemarthrosis.

There is no lytic or sclerotic osseous lesion. The soft tissues are
normal. There is no fluid collection, hematoma or soft tissue mass.
The muscles appear normal.
IMPRESSION: Mildly comminuted and depressed right lateral tibial plateau
fracture with approximately 5 mm of depression along the peripheral
aspect of the lateral tibial plateau.

## 2015-08-29 DIAGNOSIS — Z634 Disappearance and death of family member: Secondary | ICD-10-CM | POA: Diagnosis not present

## 2015-08-29 DIAGNOSIS — R000 Tachycardia, unspecified: Secondary | ICD-10-CM | POA: Diagnosis not present

## 2015-08-29 DIAGNOSIS — F331 Major depressive disorder, recurrent, moderate: Secondary | ICD-10-CM | POA: Diagnosis not present

## 2015-08-29 DIAGNOSIS — F430 Acute stress reaction: Secondary | ICD-10-CM | POA: Diagnosis not present

## 2015-10-29 DIAGNOSIS — M25511 Pain in right shoulder: Secondary | ICD-10-CM | POA: Diagnosis not present

## 2015-10-29 DIAGNOSIS — F5105 Insomnia due to other mental disorder: Secondary | ICD-10-CM | POA: Diagnosis not present

## 2015-10-29 DIAGNOSIS — F411 Generalized anxiety disorder: Secondary | ICD-10-CM | POA: Diagnosis not present

## 2015-10-29 DIAGNOSIS — Z0001 Encounter for general adult medical examination with abnormal findings: Secondary | ICD-10-CM | POA: Diagnosis not present

## 2015-10-29 DIAGNOSIS — Z634 Disappearance and death of family member: Secondary | ICD-10-CM | POA: Diagnosis not present

## 2015-10-29 DIAGNOSIS — K589 Irritable bowel syndrome without diarrhea: Secondary | ICD-10-CM | POA: Diagnosis not present

## 2015-10-29 DIAGNOSIS — I10 Essential (primary) hypertension: Secondary | ICD-10-CM | POA: Diagnosis not present

## 2015-10-29 DIAGNOSIS — F331 Major depressive disorder, recurrent, moderate: Secondary | ICD-10-CM | POA: Diagnosis not present

## 2015-10-30 ENCOUNTER — Other Ambulatory Visit: Payer: Self-pay | Admitting: Physician Assistant

## 2015-10-30 DIAGNOSIS — M25511 Pain in right shoulder: Secondary | ICD-10-CM

## 2015-11-13 DIAGNOSIS — M25511 Pain in right shoulder: Secondary | ICD-10-CM | POA: Diagnosis not present

## 2015-11-13 DIAGNOSIS — J060 Acute laryngopharyngitis: Secondary | ICD-10-CM | POA: Diagnosis not present

## 2015-12-04 DIAGNOSIS — M7531 Calcific tendinitis of right shoulder: Secondary | ICD-10-CM | POA: Diagnosis not present

## 2015-12-04 DIAGNOSIS — M25511 Pain in right shoulder: Secondary | ICD-10-CM | POA: Diagnosis not present

## 2015-12-29 ENCOUNTER — Ambulatory Visit: Payer: Medicare Other | Admitting: Physical Therapy

## 2015-12-29 DIAGNOSIS — F17210 Nicotine dependence, cigarettes, uncomplicated: Secondary | ICD-10-CM | POA: Diagnosis not present

## 2015-12-29 DIAGNOSIS — I10 Essential (primary) hypertension: Secondary | ICD-10-CM | POA: Diagnosis not present

## 2015-12-29 DIAGNOSIS — F331 Major depressive disorder, recurrent, moderate: Secondary | ICD-10-CM | POA: Diagnosis not present

## 2015-12-29 DIAGNOSIS — J069 Acute upper respiratory infection, unspecified: Secondary | ICD-10-CM | POA: Diagnosis not present

## 2015-12-31 ENCOUNTER — Encounter: Payer: Medicare Other | Admitting: Physical Therapy

## 2015-12-31 DIAGNOSIS — E782 Mixed hyperlipidemia: Secondary | ICD-10-CM | POA: Diagnosis not present

## 2015-12-31 DIAGNOSIS — E559 Vitamin D deficiency, unspecified: Secondary | ICD-10-CM | POA: Diagnosis not present

## 2015-12-31 DIAGNOSIS — Z0001 Encounter for general adult medical examination with abnormal findings: Secondary | ICD-10-CM | POA: Diagnosis not present

## 2016-01-05 ENCOUNTER — Encounter: Payer: Medicare Other | Admitting: Physical Therapy

## 2016-01-23 DIAGNOSIS — R635 Abnormal weight gain: Secondary | ICD-10-CM | POA: Diagnosis not present

## 2016-01-23 DIAGNOSIS — F5105 Insomnia due to other mental disorder: Secondary | ICD-10-CM | POA: Diagnosis not present

## 2016-01-23 DIAGNOSIS — F331 Major depressive disorder, recurrent, moderate: Secondary | ICD-10-CM | POA: Diagnosis not present

## 2016-01-23 DIAGNOSIS — I10 Essential (primary) hypertension: Secondary | ICD-10-CM | POA: Diagnosis not present

## 2016-01-23 DIAGNOSIS — E782 Mixed hyperlipidemia: Secondary | ICD-10-CM | POA: Diagnosis not present

## 2016-01-23 DIAGNOSIS — F411 Generalized anxiety disorder: Secondary | ICD-10-CM | POA: Diagnosis not present

## 2016-01-23 DIAGNOSIS — F17210 Nicotine dependence, cigarettes, uncomplicated: Secondary | ICD-10-CM | POA: Diagnosis not present

## 2016-03-08 DIAGNOSIS — F17210 Nicotine dependence, cigarettes, uncomplicated: Secondary | ICD-10-CM | POA: Diagnosis not present

## 2016-03-08 DIAGNOSIS — I10 Essential (primary) hypertension: Secondary | ICD-10-CM | POA: Diagnosis not present

## 2016-03-08 DIAGNOSIS — F331 Major depressive disorder, recurrent, moderate: Secondary | ICD-10-CM | POA: Diagnosis not present

## 2016-03-08 DIAGNOSIS — F411 Generalized anxiety disorder: Secondary | ICD-10-CM | POA: Diagnosis not present

## 2016-03-23 DIAGNOSIS — I10 Essential (primary) hypertension: Secondary | ICD-10-CM | POA: Diagnosis not present

## 2016-03-23 DIAGNOSIS — F17210 Nicotine dependence, cigarettes, uncomplicated: Secondary | ICD-10-CM | POA: Diagnosis not present

## 2016-03-23 DIAGNOSIS — J069 Acute upper respiratory infection, unspecified: Secondary | ICD-10-CM | POA: Diagnosis not present

## 2016-03-23 DIAGNOSIS — J09X2 Influenza due to identified novel influenza A virus with other respiratory manifestations: Secondary | ICD-10-CM | POA: Diagnosis not present

## 2016-04-12 DIAGNOSIS — F411 Generalized anxiety disorder: Secondary | ICD-10-CM | POA: Diagnosis not present

## 2016-04-12 DIAGNOSIS — F329 Major depressive disorder, single episode, unspecified: Secondary | ICD-10-CM | POA: Diagnosis not present

## 2016-04-12 DIAGNOSIS — E782 Mixed hyperlipidemia: Secondary | ICD-10-CM | POA: Diagnosis not present

## 2016-04-12 DIAGNOSIS — F17210 Nicotine dependence, cigarettes, uncomplicated: Secondary | ICD-10-CM | POA: Diagnosis not present

## 2016-04-12 DIAGNOSIS — I10 Essential (primary) hypertension: Secondary | ICD-10-CM | POA: Diagnosis not present

## 2016-04-14 DIAGNOSIS — F331 Major depressive disorder, recurrent, moderate: Secondary | ICD-10-CM | POA: Diagnosis not present

## 2016-04-29 DIAGNOSIS — F331 Major depressive disorder, recurrent, moderate: Secondary | ICD-10-CM | POA: Diagnosis not present

## 2016-05-04 DIAGNOSIS — F331 Major depressive disorder, recurrent, moderate: Secondary | ICD-10-CM | POA: Diagnosis not present

## 2016-05-20 DIAGNOSIS — F430 Acute stress reaction: Secondary | ICD-10-CM | POA: Diagnosis not present

## 2016-05-20 DIAGNOSIS — J060 Acute laryngopharyngitis: Secondary | ICD-10-CM | POA: Diagnosis not present

## 2016-05-20 DIAGNOSIS — Z634 Disappearance and death of family member: Secondary | ICD-10-CM | POA: Diagnosis not present

## 2016-05-27 DIAGNOSIS — F331 Major depressive disorder, recurrent, moderate: Secondary | ICD-10-CM | POA: Diagnosis not present

## 2016-06-10 DIAGNOSIS — F5105 Insomnia due to other mental disorder: Secondary | ICD-10-CM | POA: Diagnosis not present

## 2016-06-10 DIAGNOSIS — J060 Acute laryngopharyngitis: Secondary | ICD-10-CM | POA: Diagnosis not present

## 2016-06-10 DIAGNOSIS — M25511 Pain in right shoulder: Secondary | ICD-10-CM | POA: Diagnosis not present

## 2016-06-10 DIAGNOSIS — F331 Major depressive disorder, recurrent, moderate: Secondary | ICD-10-CM | POA: Diagnosis not present

## 2016-06-18 DIAGNOSIS — M7551 Bursitis of right shoulder: Secondary | ICD-10-CM | POA: Diagnosis not present

## 2016-06-18 DIAGNOSIS — M5412 Radiculopathy, cervical region: Secondary | ICD-10-CM | POA: Diagnosis not present

## 2016-07-09 DIAGNOSIS — M7551 Bursitis of right shoulder: Secondary | ICD-10-CM | POA: Diagnosis not present

## 2016-07-09 DIAGNOSIS — M5412 Radiculopathy, cervical region: Secondary | ICD-10-CM | POA: Diagnosis not present

## 2016-07-22 DIAGNOSIS — F331 Major depressive disorder, recurrent, moderate: Secondary | ICD-10-CM | POA: Diagnosis not present

## 2016-09-02 DIAGNOSIS — F332 Major depressive disorder, recurrent severe without psychotic features: Secondary | ICD-10-CM | POA: Diagnosis not present

## 2016-09-02 DIAGNOSIS — F418 Other specified anxiety disorders: Secondary | ICD-10-CM | POA: Diagnosis not present

## 2016-09-07 DIAGNOSIS — F17210 Nicotine dependence, cigarettes, uncomplicated: Secondary | ICD-10-CM | POA: Diagnosis not present

## 2016-09-07 DIAGNOSIS — Z634 Disappearance and death of family member: Secondary | ICD-10-CM | POA: Diagnosis not present

## 2016-09-07 DIAGNOSIS — F329 Major depressive disorder, single episode, unspecified: Secondary | ICD-10-CM | POA: Diagnosis not present

## 2016-09-07 DIAGNOSIS — H6693 Otitis media, unspecified, bilateral: Secondary | ICD-10-CM | POA: Diagnosis not present

## 2016-09-07 DIAGNOSIS — I10 Essential (primary) hypertension: Secondary | ICD-10-CM | POA: Diagnosis not present

## 2016-11-08 DIAGNOSIS — F17210 Nicotine dependence, cigarettes, uncomplicated: Secondary | ICD-10-CM | POA: Diagnosis not present

## 2016-11-08 DIAGNOSIS — H8149 Vertigo of central origin, unspecified ear: Secondary | ICD-10-CM | POA: Diagnosis not present

## 2016-11-08 DIAGNOSIS — F5105 Insomnia due to other mental disorder: Secondary | ICD-10-CM | POA: Diagnosis not present

## 2016-11-08 DIAGNOSIS — F331 Major depressive disorder, recurrent, moderate: Secondary | ICD-10-CM | POA: Diagnosis not present

## 2016-11-08 DIAGNOSIS — J069 Acute upper respiratory infection, unspecified: Secondary | ICD-10-CM | POA: Diagnosis not present

## 2016-11-17 DIAGNOSIS — H8143 Vertigo of central origin, bilateral: Secondary | ICD-10-CM | POA: Diagnosis not present

## 2016-11-17 DIAGNOSIS — J019 Acute sinusitis, unspecified: Secondary | ICD-10-CM | POA: Diagnosis not present

## 2016-12-01 DIAGNOSIS — R079 Chest pain, unspecified: Secondary | ICD-10-CM | POA: Diagnosis not present

## 2016-12-01 DIAGNOSIS — M542 Cervicalgia: Secondary | ICD-10-CM | POA: Diagnosis not present

## 2016-12-01 DIAGNOSIS — H9203 Otalgia, bilateral: Secondary | ICD-10-CM | POA: Diagnosis not present

## 2016-12-01 DIAGNOSIS — J019 Acute sinusitis, unspecified: Secondary | ICD-10-CM | POA: Diagnosis not present

## 2017-01-11 ENCOUNTER — Other Ambulatory Visit: Payer: Self-pay

## 2017-01-11 ENCOUNTER — Emergency Department: Payer: PRIVATE HEALTH INSURANCE

## 2017-01-11 ENCOUNTER — Emergency Department
Admission: EM | Admit: 2017-01-11 | Discharge: 2017-01-11 | Disposition: A | Discharge status: Home / Self Care | Payer: PRIVATE HEALTH INSURANCE | Attending: Emergency Medicine | Admitting: Emergency Medicine

## 2017-01-11 ENCOUNTER — Encounter: Payer: Self-pay | Admitting: Emergency Medicine

## 2017-01-11 DIAGNOSIS — R079 Chest pain, unspecified: Secondary | ICD-10-CM | POA: Diagnosis not present

## 2017-01-11 DIAGNOSIS — Z5321 Procedure and treatment not carried out due to patient leaving prior to being seen by health care provider: Secondary | ICD-10-CM | POA: Diagnosis not present

## 2017-01-11 DIAGNOSIS — R0789 Other chest pain: Secondary | ICD-10-CM | POA: Diagnosis not present

## 2017-01-11 LAB — BASIC METABOLIC PANEL
Anion gap: 9 (ref 5–15)
BUN: 12 mg/dL (ref 6–20)
CO2: 21 mmol/L — ABNORMAL LOW (ref 22–32)
Calcium: 9.5 mg/dL (ref 8.9–10.3)
Chloride: 105 mmol/L (ref 101–111)
Creatinine, Ser: 0.78 mg/dL (ref 0.44–1.00)
GFR calc Af Amer: >60 mL/min (ref >60)
GFR calc non Af Amer: >60 mL/min (ref >60)
Glucose, Bld: 91 mg/dL (ref 65–99)
Potassium: 3.6 mmol/L (ref 3.5–5.1)
Sodium: 135 mmol/L (ref 135–145)

## 2017-01-11 LAB — CBC
HCT: 41.6 % (ref 35.0–47.0)
Hemoglobin: 14.5 g/dL (ref 12.0–16.0)
MCH: 31.3 pg (ref 26.0–34.0)
MCHC: 34.8 g/dL (ref 32.0–36.0)
MCV: 89.7 fL (ref 80.0–100.0)
Platelets: 283 10*3/uL (ref 150–440)
RBC: 4.64 MIL/uL (ref 3.80–5.20)
RDW: 12.8 % (ref 11.5–14.5)
WBC: 8.2 10*3/uL (ref 3.6–11.0)

## 2017-01-11 LAB — TROPONIN I: Troponin I: <0.03 ng/mL (ref <0.03)
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
## 2017-01-11 NOTE — ED Notes (Signed)
Pt came to desk and states she was leaving, pt encouraged to stay but states she does not want to wait

## 2017-01-18 DIAGNOSIS — H6693 Otitis media, unspecified, bilateral: Secondary | ICD-10-CM | POA: Diagnosis not present

## 2017-01-18 DIAGNOSIS — F331 Major depressive disorder, recurrent, moderate: Secondary | ICD-10-CM | POA: Diagnosis not present

## 2017-01-18 DIAGNOSIS — Z634 Disappearance and death of family member: Secondary | ICD-10-CM | POA: Diagnosis not present

## 2017-01-18 DIAGNOSIS — I10 Essential (primary) hypertension: Secondary | ICD-10-CM | POA: Diagnosis not present

## 2017-02-28 ENCOUNTER — Encounter: Payer: Self-pay | Admitting: Nurse Practitioner

## 2017-03-11 ENCOUNTER — Other Ambulatory Visit: Payer: Self-pay | Admitting: Internal Medicine

## 2017-03-22 ENCOUNTER — Ambulatory Visit: Payer: Self-pay | Admitting: Nurse Practitioner

## 2017-04-21 ENCOUNTER — Ambulatory Visit: Payer: Self-pay | Admitting: Nurse Practitioner

## 2017-05-03 ENCOUNTER — Ambulatory Visit: Payer: Self-pay | Admitting: Nurse Practitioner

## 2017-05-03 ENCOUNTER — Encounter: Payer: Self-pay | Admitting: Nurse Practitioner

## 2017-06-16 ENCOUNTER — Ambulatory Visit (INDEPENDENT_AMBULATORY_CARE_PROVIDER_SITE_OTHER): Payer: PRIVATE HEALTH INSURANCE | Admitting: Nurse Practitioner

## 2017-06-16 ENCOUNTER — Encounter: Payer: Self-pay | Admitting: Nurse Practitioner

## 2017-06-16 VITALS — BP 130/66 | HR 100 | Resp 16 | Ht 61.0 in | Wt 141.2 lb

## 2017-06-16 DIAGNOSIS — R5383 Other fatigue: Secondary | ICD-10-CM | POA: Diagnosis not present

## 2017-06-16 DIAGNOSIS — E559 Vitamin D deficiency, unspecified: Secondary | ICD-10-CM | POA: Diagnosis not present

## 2017-06-16 DIAGNOSIS — R7301 Impaired fasting glucose: Secondary | ICD-10-CM | POA: Insufficient documentation

## 2017-06-16 DIAGNOSIS — R252 Cramp and spasm: Secondary | ICD-10-CM | POA: Diagnosis not present

## 2017-06-16 DIAGNOSIS — E538 Deficiency of other specified B group vitamins: Secondary | ICD-10-CM | POA: Diagnosis not present

## 2017-06-16 NOTE — Progress Notes (Signed)
[facility] [place], [place] [address]  Internal MEDICINE  Office Visit Note  Patient Name: Pamela Mckee  [id number]  [id number]  Date of Service: 06/16/2017  Chief Complaint  Patient presents with  . cramping in hands and feet.     The patient is complaining of cramping in her hands and feet, bilaterally. Has been happening off and on for past 6 months. She is also c/o increased fatigue. Drinking plenty of water. Feels healthy. No chest pain, shortness of breath. No issues with bowels or bladder.   Pt is here for a sick visit.     Current Medication:  Outpatient Encounter Medications as of 06/16/2017  Medication Sig Note  . acetaminophen (TYLENOL) 325 MG tablet Take by mouth. 12/24/2014: Received from: [place]  . DULoxetine (CYMBALTA) 60 MG capsule TAKE 1 CAPSULE BY MOUTH ONCE DAILY FOR DEPRESSION   . Multiple Vitamins-Minerals (MULTIVITAMIN ADULT PO) Take by mouth.   Marland Kitchen atorvastatin (LIPITOR) 10 MG tablet  12/24/2014: Received from: External Pharmacy  . ketoconazole (NIZORAL) 2 % cream Apply to affected areas 1-3 times a week as needed for affected areas on trunk 12/24/2014: Received from: [facility]  . lamoTRIgine (LAMICTAL) 25 MG tablet Take 2 tablets (50 mg total) by mouth daily. (Patient not taking: Reported on 06/16/2017)   . LORazepam (ATIVAN) 1 MG tablet  07/01/2015: Received from: External Pharmacy  . traZODone (DESYREL) 50 MG tablet TAKE 1 TO 2 TABLETS BY MOUTH AT BEDTIME (Patient not taking: Reported on 06/16/2017)   . Vitamin D, Ergocalciferol, (DRISDOL) 50000 UNITS CAPS capsule Take by mouth. 12/24/2014: Received from: [place]   No facility-administered encounter medications on file as of 06/16/2017.       Medical History: Past Medical History:  Diagnosis Date  . Anxiety   . Depression      Today's Vitals   06/16/17 1413  BP: 130/66  Pulse: 100  Resp: 16  SpO2: 98%   Weight: 141 lb 3.2 oz (64 kg)  Height: 5\' 1"  (1.549 m)    Review of Systems  Constitutional: Negative for activity change, chills, fatigue and unexpected weight change.  HENT: Negative for congestion, postnasal drip, rhinorrhea, sneezing and sore throat.   Eyes: Negative.  Negative for redness.  Respiratory: Negative for cough, chest tightness, shortness of breath and wheezing.   Cardiovascular: Negative for chest pain and palpitations.  Gastrointestinal: Negative for abdominal distention, abdominal pain, constipation, diarrhea, nausea and vomiting.  Endocrine: Negative for cold intolerance, heat intolerance, polydipsia, polyphagia and polyuria.  Genitourinary: Negative for dysuria and frequency.  Musculoskeletal: Positive for arthralgias. Negative for back pain, joint swelling and neck pain.  Skin: Negative for rash.  Allergic/Immunologic: Negative for environmental allergies.  Neurological: Negative for dizziness, tremors, numbness and headaches.  Hematological: Negative for adenopathy. Does not bruise/bleed easily.  Psychiatric/Behavioral: Positive for dysphoric mood. Negative for behavioral problems (Depression), sleep disturbance and suicidal ideas. The patient is not nervous/anxious.     Physical Exam  Constitutional: She is oriented to person, place, and time. She appears well-developed and well-nourished. No distress.  HENT:  Head: Normocephalic and atraumatic.  Mouth/Throat: Oropharynx is clear and moist. No oropharyngeal exudate.  Eyes: Pupils are equal, round, and reactive to light. Conjunctivae and EOM are normal.  Neck: Normal range of motion. Neck supple. No JVD present. Carotid bruit is not present. No tracheal deviation present. No thyromegaly present.  Cardiovascular: Normal rate, regular rhythm, normal heart sounds  and intact distal pulses. Exam reveals no gallop and no friction rub.  No murmur heard. Pulmonary/Chest: Effort normal and breath sounds normal. No  respiratory distress. She has no wheezes. She has no rales. She exhibits no tenderness.  Abdominal: Soft. Bowel sounds are normal. There is no tenderness.  Musculoskeletal: Normal range of motion.  Lymphadenopathy:    She has no cervical adenopathy.  Neurological: She is alert and oriented to person, place, and time. No cranial nerve deficit.  Skin: Skin is warm and dry. Capillary refill takes less than 2 seconds. She is not diaphoretic.  Psychiatric: She has a normal mood and affect. Her behavior is normal. Judgment and thought content normal.  Nursing note and vitals reviewed.  Assessment/Plan: 1. Fatigue, unspecified type Check labs. Treat as indicated . - CBC With Differential - Comprehensive metabolic panel - TSH + free T4 - Iron, TIBC and Ferritin Panel - Vitamin B12 - Magnesium  2. Cramping of hands Check labs and treat as indicated.  - Comprehensive metabolic panel  3. Leg cramping - Hemoglobin A1c - Magnesium - Phosphorus  4. Vitamin B12 deficiency Check anemia/b12 levels and treat as indicated.  - Vitamin B12  5. Vitamin D deficiency - Vitamin D 1,25 dihydroxy  6. Impaired fasting glucose - Hemoglobin A1c   General Counseling: Pamela Mckee verbalizes understanding of the findings of todays visit and agrees with plan of treatment. I have discussed any further diagnostic evaluation that may be needed or ordered today. We also reviewed her medications today. she has been encouraged to call the office with any questions or concerns that should arise related to todays visit.    This patient was seen by Leretha Pol, FNP- C in Collaboration with Dr Lavera Guise as a part of collaborative care agreement    Orders Placed This Encounter  Procedures  . CBC With Differential  . Comprehensive metabolic panel  . TSH + free T4  . Iron, TIBC and Ferritin Panel  . Vitamin B12  . Vitamin D 1,25 dihydroxy  . Hemoglobin A1c  . Magnesium  . Phosphorus      Time spent:  15 Minutes

## 2017-06-17 DIAGNOSIS — R252 Cramp and spasm: Secondary | ICD-10-CM | POA: Diagnosis not present

## 2017-06-17 DIAGNOSIS — R5383 Other fatigue: Secondary | ICD-10-CM | POA: Diagnosis not present

## 2017-06-17 DIAGNOSIS — E538 Deficiency of other specified B group vitamins: Secondary | ICD-10-CM | POA: Diagnosis not present

## 2017-06-17 DIAGNOSIS — E559 Vitamin D deficiency, unspecified: Secondary | ICD-10-CM | POA: Diagnosis not present

## 2017-06-17 DIAGNOSIS — R7301 Impaired fasting glucose: Secondary | ICD-10-CM | POA: Diagnosis not present

## 2017-06-24 ENCOUNTER — Other Ambulatory Visit: Payer: Self-pay | Admitting: Internal Medicine

## 2017-06-24 LAB — COMPREHENSIVE METABOLIC PANEL
ALT: 10 IU/L (ref 0–32)
AST: 14 IU/L (ref 0–40)
Albumin/Globulin Ratio: 1.8 (ref 1.2–2.2)
Albumin: 4.4 g/dL (ref 3.6–4.8)
Alkaline Phosphatase: 74 IU/L (ref 39–117)
BUN/Creatinine Ratio: 12 (ref 12–28)
BUN: 9 mg/dL (ref 8–27)
Bilirubin Total: 0.3 mg/dL (ref 0.0–1.2)
CO2: 24 mmol/L (ref 20–29)
Calcium: 9.9 mg/dL (ref 8.7–10.3)
Chloride: 102 mmol/L (ref 96–106)
Creatinine, Ser: 0.77 mg/dL (ref 0.57–1.00)
GFR calc Af Amer: 96 mL/min/{1.73_m2} (ref >59)
GFR calc non Af Amer: 84 mL/min/{1.73_m2} (ref >59)
Globulin, Total: 2.5 g/dL (ref 1.5–4.5)
Glucose: 77 mg/dL (ref 65–99)
Potassium: 4.7 mmol/L (ref 3.5–5.2)
Sodium: 140 mmol/L (ref 134–144)
Total Protein: 6.9 g/dL (ref 6.0–8.5)

## 2017-06-24 LAB — CBC WITH DIFFERENTIAL
Basophils Absolute: 0.1 10*3/uL (ref 0.0–0.2)
Basos: 1 %
EOS (ABSOLUTE): 0.3 10*3/uL (ref 0.0–0.4)
Eos: 4 %
Hematocrit: 40.9 % (ref 34.0–46.6)
Hemoglobin: 14.3 g/dL (ref 11.1–15.9)
Immature Grans (Abs): 0 10*3/uL (ref 0.0–0.1)
Immature Granulocytes: 0 %
Lymphocytes Absolute: 4.1 10*3/uL — ABNORMAL HIGH (ref 0.7–3.1)
Lymphs: 51 %
MCH: 31.3 pg (ref 26.6–33.0)
MCHC: 35 g/dL (ref 31.5–35.7)
MCV: 90 fL (ref 79–97)
Monocytes Absolute: 0.4 10*3/uL (ref 0.1–0.9)
Monocytes: 6 %
Neutrophils Absolute: 3 10*3/uL (ref 1.4–7.0)
Neutrophils: 38 %
RBC: 4.57 x10E6/uL (ref 3.77–5.28)
RDW: 12.9 % (ref 12.3–15.4)
WBC: 7.8 10*3/uL (ref 3.4–10.8)

## 2017-06-24 LAB — IRON,TIBC AND FERRITIN PANEL
Ferritin: 67 ng/mL (ref 15–150)
Iron Saturation: 25 % (ref 15–55)
Iron: 83 ug/dL (ref 27–139)
Total Iron Binding Capacity: 330 ug/dL (ref 250–450)
UIBC: 247 ug/dL (ref 118–369)

## 2017-06-24 LAB — VITAMIN D 1,25 DIHYDROXY
Vitamin D 1, 25 (OH)2 Total: 67 pg/mL — ABNORMAL HIGH
Vitamin D2 1, 25 (OH)2: <10 pg/mL
Vitamin D3 1, 25 (OH)2: 64 pg/mL

## 2017-06-24 LAB — PHOSPHORUS: Phosphorus: 3 mg/dL (ref 2.5–4.5)

## 2017-06-24 LAB — HEMOGLOBIN A1C
Est. average glucose Bld gHb Est-mCnc: 103 mg/dL
Hgb A1c MFr Bld: 5.2 % (ref 4.8–5.6)

## 2017-06-24 LAB — MAGNESIUM: Magnesium: 2.2 mg/dL (ref 1.6–2.3)

## 2017-06-24 LAB — TSH+FREE T4
Free T4: 1.09 ng/dL (ref 0.82–1.77)
TSH: 2.01 u[IU]/mL (ref 0.450–4.500)

## 2017-06-24 LAB — VITAMIN B12: Vitamin B-12: 1178 pg/mL (ref 232–1245)

## 2017-07-04 ENCOUNTER — Ambulatory Visit (INDEPENDENT_AMBULATORY_CARE_PROVIDER_SITE_OTHER): Payer: PRIVATE HEALTH INSURANCE | Admitting: Nurse Practitioner

## 2017-07-04 ENCOUNTER — Encounter: Payer: Self-pay | Admitting: Nurse Practitioner

## 2017-07-04 VITALS — BP 135/80 | HR 91 | Resp 16 | Ht 60.0 in | Wt 143.2 lb

## 2017-07-04 DIAGNOSIS — R252 Cramp and spasm: Secondary | ICD-10-CM | POA: Diagnosis not present

## 2017-07-04 DIAGNOSIS — R5383 Other fatigue: Secondary | ICD-10-CM | POA: Diagnosis not present

## 2017-07-04 NOTE — Progress Notes (Signed)
[facility] [place], [place] [address]  Internal MEDICINE  Office Visit Note  Patient Name: Pamela Mckee  [id number]  [id number]  Date of Service: 07/04/2017  Chief Complaint  Patient presents with  . Fatigue    review lab results     The patient c/o fatigue. Still having some cramping in hands and feet. Had labs done and is here to discuss results. All labs looked good. HgbA1c was within normal limits. She had normal magnesium, phosphorus, liver, and kidney functions.    Pt is here for routine follow up.    Current Medication: Outpatient Encounter Medications as of 07/04/2017  Medication Sig Note  . acetaminophen (TYLENOL) 325 MG tablet Take by mouth. 12/24/2014: Received from: [facility]  . atorvastatin (LIPITOR) 10 MG tablet  12/24/2014: Received from: External Pharmacy  . DULoxetine (CYMBALTA) 60 MG capsule TAKE 1 CAPSULE BY MOUTH ONCE DAILY FOR DEPRESSION   . ketoconazole (NIZORAL) 2 % cream Apply to affected areas 1-3 times a week as needed for affected areas on trunk 12/24/2014: Received from: [facility]  . lamoTRIgine (LAMICTAL) 25 MG tablet Take 2 tablets (50 mg total) by mouth daily. (Patient not taking: Reported on 06/16/2017)   . LORazepam (ATIVAN) 1 MG tablet  07/01/2015: Received from: External Pharmacy  . Multiple Vitamins-Minerals (MULTIVITAMIN ADULT PO) Take by mouth.   . traZODone (DESYREL) 50 MG tablet TAKE 1 TO 2 TABLETS BY MOUTH AT BEDTIME (Patient not taking: Reported on 06/16/2017)   . Vitamin D, Ergocalciferol, (DRISDOL) 50000 UNITS CAPS capsule Take by mouth. 12/24/2014: Received from: [facility]   No facility-administered encounter medications on file as of 07/04/2017.     Surgical History: Past Surgical History:  Procedure Laterality Date  . CESAREAN SECTION    . LEG SURGERY Right     Medical History: Past Medical History:  Diagnosis Date  . Anxiety   . Depression      Family History: Family History  Problem Relation Age of Onset  . Alzheimer's disease Mother   . Dementia Mother   . Anxiety disorder Sister   . Thyroid cancer Sister     Social History   Socioeconomic History  . Marital status: Married    Spouse name: Not on file  . Number of children: Not on file  . Years of education: Not on file  . Highest education level: Not on file  Occupational History  . Not on file  Social Needs  . Financial resource strain: Not on file  . Food insecurity:    Worry: Not on file    Inability: Not on file  . Transportation needs:    Medical: Not on file    Non-medical: Not on file  Tobacco Use  . Smoking status: Current Some Day Smoker    Packs/day: 0.20    Types: Cigarettes    Start date: 12/24/1971  . Smokeless tobacco: Never Used  . Tobacco comment: occasionally   Substance and Sexual Activity  . Alcohol use: No    Alcohol/week: 0.0 oz  . Drug use: No    Comment: not since 1988  . Sexual activity: Not Currently  Lifestyle  . Physical activity:    Days per week: Not on file    Minutes per session: Not on file  . Stress: Not on file  Relationships  . Social connections:    Talks on phone: Not on file    Gets together: Not on  file    Attends religious service: Not on file    Active member of club or organization: Not on file    Attends meetings of clubs or organizations: Not on file    Relationship status: Not on file  . Intimate partner violence:    Fear of current or ex partner: Not on file    Emotionally abused: Not on file    Physically abused: Not on file    Forced sexual activity: Not on file  Other Topics Concern  . Not on file  Social History Narrative  . Not on file      Review of Systems  Constitutional: Positive for fatigue. Negative for activity change, chills and unexpected weight change.  HENT: Negative for congestion, postnasal drip, rhinorrhea, sneezing and sore throat.   Eyes: Negative.  Negative for  redness.  Respiratory: Negative for cough, chest tightness, shortness of breath and wheezing.   Cardiovascular: Negative for chest pain and palpitations.  Gastrointestinal: Negative for abdominal distention, abdominal pain, constipation, diarrhea, nausea and vomiting.  Endocrine: Negative for cold intolerance, heat intolerance, polydipsia, polyphagia and polyuria.  Genitourinary: Negative for dysuria and frequency.  Musculoskeletal: Positive for arthralgias and myalgias. Negative for back pain, joint swelling and neck pain.       Cramping in hands and lower legs .  Skin: Negative for rash.  Allergic/Immunologic: Negative for environmental allergies.  Neurological: Negative for dizziness, tremors, numbness and headaches.  Hematological: Negative for adenopathy. Does not bruise/bleed easily.  Psychiatric/Behavioral: Positive for dysphoric mood. Negative for behavioral problems (Depression), sleep disturbance and suicidal ideas. The patient is not nervous/anxious.     Vital Signs: BP 135/80   Pulse 91   Resp 16   Ht 5' (1.524 m)   Wt 143 lb 3.2 oz (65 kg)   SpO2 97%   BMI 27.97 kg/m    Physical Exam  Constitutional: She is oriented to person, place, and time. She appears well-developed and well-nourished. No distress.  HENT:  Head: Normocephalic and atraumatic.  Mouth/Throat: Oropharynx is clear and moist. No oropharyngeal exudate.  Eyes: Pupils are equal, round, and reactive to light. Conjunctivae and EOM are normal.  Neck: Normal range of motion. Neck supple. No JVD present. Carotid bruit is not present. No tracheal deviation present. No thyromegaly present.  Cardiovascular: Normal rate, regular rhythm, normal heart sounds and intact distal pulses. Exam reveals no gallop and no friction rub.  No murmur heard. Pulmonary/Chest: Effort normal and breath sounds normal. No respiratory distress. She has no wheezes. She has no rales. She exhibits no tenderness.  Abdominal: Soft. Bowel  sounds are normal. There is no tenderness.  Musculoskeletal: Normal range of motion.  Lymphadenopathy:    She has no cervical adenopathy.  Neurological: She is alert and oriented to person, place, and time. No cranial nerve deficit.  Skin: Skin is warm and dry. Capillary refill takes less than 2 seconds. She is not diaphoretic.  Psychiatric: She has a normal mood and affect. Her behavior is normal. Judgment and thought content normal.  Nursing note and vitals reviewed.  Assessment/Plan:  1. Fatigue, unspecified type Reviewed labs. All within normal range. Will continue to monitor.   2. Cramping of hands Normal mag/phos/renal/and liver functions. Encouraged increased water intake. Consider OTC supplements of magnesium and calcium.   3. Leg cramping Normal mag/phos/renal/and liver functions. Encouraged increased water intake. Consider OTC supplements of magnesium and calcium.    General Counseling: Jaloni verbalizes understanding of the findings of todays visit and  agrees with plan of treatment. I have discussed any further diagnostic evaluation that may be needed or ordered today. We also reviewed her medications today. she has been encouraged to call the office with any questions or concerns that should arise related to todays visit.   This patient was seen by Leretha Pol, FNP- C in Collaboration with Dr Lavera Guise as a part of collaborative care agreement  Time spent: 19 Minutes     Dr Lavera Guise Internal medicine

## 2017-07-07 ENCOUNTER — Other Ambulatory Visit: Payer: Self-pay | Admitting: Nurse Practitioner

## 2017-07-07 MED ORDER — TRAZODONE HCL 50 MG PO TABS: 50.0000 mg | ORAL_TABLET | Freq: Every day | ORAL | 3 refills | Status: DC

## 2017-08-04 ENCOUNTER — Telehealth: Payer: Self-pay

## 2017-08-04 NOTE — Telephone Encounter (Signed)
Attempted to contact patient regarding her Medicare Annual Wellness Visit, spoke with a family member to either schedule her AWV or determine who is her new/current PCP. Patient relayed info that she would not be coming here anymore for PC and I could not obtain the other information because they disconnected the call

## 2017-09-08 ENCOUNTER — Other Ambulatory Visit: Payer: Self-pay

## 2017-09-08 MED ORDER — DULOXETINE HCL 60 MG PO CPEP: ORAL_CAPSULE | ORAL | 1 refills | Status: DC

## 2017-09-14 DIAGNOSIS — F321 Major depressive disorder, single episode, moderate: Secondary | ICD-10-CM | POA: Diagnosis not present

## 2017-09-14 DIAGNOSIS — F4329 Adjustment disorder with other symptoms: Secondary | ICD-10-CM | POA: Diagnosis not present

## 2017-09-14 DIAGNOSIS — Z5320 Procedure and treatment not carried out because of patient's decision for unspecified reasons: Secondary | ICD-10-CM | POA: Diagnosis not present

## 2017-09-14 DIAGNOSIS — M545 Low back pain: Secondary | ICD-10-CM | POA: Diagnosis not present

## 2017-09-14 DIAGNOSIS — Z79899 Other long term (current) drug therapy: Secondary | ICD-10-CM | POA: Diagnosis not present

## 2017-09-14 DIAGNOSIS — G8929 Other chronic pain: Secondary | ICD-10-CM | POA: Diagnosis not present

## 2017-09-14 DIAGNOSIS — Z2821 Immunization not carried out because of patient refusal: Secondary | ICD-10-CM | POA: Diagnosis not present

## 2017-09-14 DIAGNOSIS — R5383 Other fatigue: Secondary | ICD-10-CM | POA: Diagnosis not present

## 2017-10-12 DIAGNOSIS — B360 Pityriasis versicolor: Secondary | ICD-10-CM | POA: Diagnosis not present

## 2017-10-12 DIAGNOSIS — F4329 Adjustment disorder with other symptoms: Secondary | ICD-10-CM | POA: Diagnosis not present

## 2017-10-12 DIAGNOSIS — F321 Major depressive disorder, single episode, moderate: Secondary | ICD-10-CM | POA: Diagnosis not present

## 2017-11-07 ENCOUNTER — Other Ambulatory Visit: Payer: Self-pay | Admitting: Internal Medicine

## 2017-11-29 ENCOUNTER — Telehealth: Payer: Self-pay

## 2017-11-29 ENCOUNTER — Other Ambulatory Visit: Payer: Self-pay

## 2017-11-29 MED ORDER — TRAZODONE HCL 50 MG PO TABS: 50.0000 mg | ORAL_TABLET | Freq: Every day | ORAL | 0 refills | Status: AC

## 2017-11-29 NOTE — Telephone Encounter (Signed)
Spoke with genel pharmist and canceled trazodone pres no longer pt here

## 2017-11-29 NOTE — Telephone Encounter (Signed)
lmom pt need appt for further refills  

## 2017-12-06 DIAGNOSIS — F419 Anxiety disorder, unspecified: Secondary | ICD-10-CM | POA: Diagnosis not present
